# Patient Record
Sex: Male | Born: 1975 | Race: White | Hispanic: No | Marital: Single | Smoking: Current every day smoker
Health system: Southern US, Community
[De-identification: ages and names within clinical notes are randomized; demographics above are authoritative.]

## PROBLEM LIST (undated history)

## (undated) DIAGNOSIS — J449 Chronic obstructive pulmonary disease, unspecified: Secondary | ICD-10-CM

## (undated) HISTORY — PX: APPENDECTOMY: SHX54

## (undated) HISTORY — PX: CHOLECYSTECTOMY: SHX55

## (undated) HISTORY — PX: OTHER SURGICAL HISTORY: SHX169

---

## 1999-05-19 ENCOUNTER — Emergency Department (HOSPITAL_COMMUNITY): Admission: EM | Admit: 1999-05-19 | Discharge: 1999-05-19 | Payer: Self-pay | Admitting: Emergency Medicine

## 1999-05-19 ENCOUNTER — Encounter: Payer: Self-pay | Admitting: Emergency Medicine

## 2004-06-11 ENCOUNTER — Emergency Department: Payer: Self-pay | Admitting: Emergency Medicine

## 2004-09-26 ENCOUNTER — Emergency Department: Payer: Self-pay | Admitting: Emergency Medicine

## 2006-07-27 ENCOUNTER — Emergency Department: Payer: Self-pay | Admitting: Unknown Physician Specialty

## 2006-08-14 ENCOUNTER — Emergency Department: Payer: Self-pay | Admitting: Unknown Physician Specialty

## 2006-09-11 ENCOUNTER — Emergency Department: Payer: Self-pay | Admitting: Emergency Medicine

## 2006-11-18 ENCOUNTER — Emergency Department: Payer: Self-pay | Admitting: Emergency Medicine

## 2007-02-15 ENCOUNTER — Emergency Department: Payer: Self-pay | Admitting: Emergency Medicine

## 2007-07-27 ENCOUNTER — Emergency Department: Payer: Self-pay | Admitting: Emergency Medicine

## 2007-12-21 ENCOUNTER — Emergency Department: Payer: Self-pay | Admitting: Emergency Medicine

## 2007-12-25 ENCOUNTER — Other Ambulatory Visit: Payer: Self-pay

## 2007-12-25 ENCOUNTER — Emergency Department: Payer: Self-pay | Admitting: Emergency Medicine

## 2008-01-12 ENCOUNTER — Emergency Department: Payer: Self-pay | Admitting: Emergency Medicine

## 2008-01-12 ENCOUNTER — Other Ambulatory Visit: Payer: Self-pay

## 2008-05-12 ENCOUNTER — Emergency Department: Payer: Self-pay | Admitting: Emergency Medicine

## 2008-05-13 ENCOUNTER — Other Ambulatory Visit: Payer: Self-pay

## 2008-09-18 ENCOUNTER — Observation Stay: Payer: Self-pay | Admitting: General Surgery

## 2008-10-09 ENCOUNTER — Emergency Department: Payer: Self-pay | Admitting: Emergency Medicine

## 2009-01-06 ENCOUNTER — Emergency Department: Payer: Self-pay | Admitting: Emergency Medicine

## 2009-02-08 ENCOUNTER — Emergency Department (HOSPITAL_COMMUNITY): Admission: EM | Admit: 2009-02-08 | Discharge: 2009-02-08 | Payer: Self-pay

## 2009-02-21 ENCOUNTER — Emergency Department: Payer: Self-pay | Admitting: Emergency Medicine

## 2009-04-08 ENCOUNTER — Emergency Department (HOSPITAL_COMMUNITY): Admission: EM | Admit: 2009-04-08 | Discharge: 2009-04-08 | Payer: Self-pay | Admitting: Emergency Medicine

## 2009-04-12 ENCOUNTER — Emergency Department: Payer: Self-pay | Admitting: Emergency Medicine

## 2009-05-09 ENCOUNTER — Emergency Department: Payer: Self-pay | Admitting: Emergency Medicine

## 2009-06-16 ENCOUNTER — Emergency Department: Payer: Self-pay

## 2014-08-30 ENCOUNTER — Emergency Department: Payer: Self-pay | Admitting: Internal Medicine

## 2014-10-01 ENCOUNTER — Emergency Department: Payer: Self-pay | Admitting: Student

## 2014-10-01 LAB — BASIC METABOLIC PANEL
Anion Gap: 6 — ABNORMAL LOW (ref 7–16)
BUN: 11 mg/dL (ref 7–18)
Calcium, Total: 8.3 mg/dL — ABNORMAL LOW (ref 8.5–10.1)
Chloride: 106 mmol/L (ref 98–107)
Co2: 28 mmol/L (ref 21–32)
Creatinine: 0.84 mg/dL (ref 0.60–1.30)
EGFR (African American): >60
EGFR (Non-African Amer.): >60
Glucose: 111 mg/dL — ABNORMAL HIGH (ref 65–99)
Osmolality: 279 (ref 275–301)
Potassium: 3.8 mmol/L (ref 3.5–5.1)
Sodium: 140 mmol/L (ref 136–145)

## 2014-10-01 LAB — CBC
HCT: 45.8 % (ref 40.0–52.0)
HGB: 15.3 g/dL (ref 13.0–18.0)
MCH: 29.7 pg (ref 26.0–34.0)
MCHC: 33.4 g/dL (ref 32.0–36.0)
MCV: 89 fL (ref 80–100)
Platelet: 294 10*3/uL (ref 150–440)
RBC: 5.16 10*6/uL (ref 4.40–5.90)
RDW: 13.3 % (ref 11.5–14.5)
WBC: 11.2 10*3/uL — ABNORMAL HIGH (ref 3.8–10.6)

## 2014-10-01 LAB — D-DIMER(ARMC): D-DIMER: 322 ng/mL

## 2014-10-01 LAB — TROPONIN I: Troponin-I: <0.02 ng/mL

## 2015-02-27 ENCOUNTER — Emergency Department: Payer: Medicaid Other

## 2015-02-27 ENCOUNTER — Encounter: Payer: Self-pay | Admitting: Emergency Medicine

## 2015-02-27 ENCOUNTER — Emergency Department
Admission: EM | Admit: 2015-02-27 | Discharge: 2015-02-27 | Disposition: A | Discharge status: Home / Self Care | Payer: Medicaid Other | Attending: Emergency Medicine | Admitting: Emergency Medicine

## 2015-02-27 DIAGNOSIS — Z720 Tobacco use: Secondary | ICD-10-CM | POA: Insufficient documentation

## 2015-02-27 DIAGNOSIS — M25562 Pain in left knee: Secondary | ICD-10-CM | POA: Diagnosis not present

## 2015-02-27 DIAGNOSIS — Z880 Allergy status to penicillin: Secondary | ICD-10-CM | POA: Diagnosis not present

## 2015-02-27 MED ORDER — KETOROLAC TROMETHAMINE 60 MG/2ML IM SOLN
INTRAMUSCULAR | Status: AC
  Administered 2015-02-27: 60 mg via INTRAMUSCULAR
  Filled 2015-02-27: qty 2

## 2015-02-27 MED ORDER — IBUPROFEN 800 MG PO TABS: 800.0000 mg | ORAL_TABLET | Freq: Three times a day (TID) | ORAL | Status: DC

## 2015-02-27 MED ORDER — KETOROLAC TROMETHAMINE 60 MG/2ML IM SOLN
60.0000 mg | Freq: Once | INTRAMUSCULAR | Status: AC
  Administered 2015-02-27: 60 mg via INTRAMUSCULAR

## 2015-02-27 NOTE — ED Provider Notes (Signed)
[facility] Emergency Department Provider Note  ____________________________________________  Time seen: [id number]  I have reviewed the triage vital signs and the nursing notes.   HISTORY  Chief Complaint Knee Pain   HPI Brent Raymond is a 39 y.o. male is here today with complaint of left knee pain. He states there is no injury involved. 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 He states today his knee began hurting and he is taking ibuprofenwithout improvement. He has been more difficult for him to walk due to the pain. Pain is constant nonradiating posterior left knee. Patient is at low risk for DVT with his only risk factor of smoking. Pain currently is 10 out of 10. Walking makes the pain worse, nothing is making it better.   History reviewed. No pertinent past medical history.  There are no active problems to display for this patient.   Past Surgical History  Procedure Laterality Date  . Appendectomy    . Cholecystectomy    . Tubes in ears as child      Current Outpatient Rx  Name  Route  Sig  Dispense  Refill  . ibuprofen (ADVIL,MOTRIN) 800 MG tablet   Oral   Take 1 tablet (800 mg total) by mouth 3 (three) times daily.   30 tablet   0     Allergies Penicillins  No family history on file.  Social History History  Substance Use Topics  . Smoking status: Current Every Day Smoker -- 1.00 packs/day    Types: Cigarettes  . Smokeless tobacco: Not on file  . Alcohol Use: No    Review of Systems Constitutional: No fever/chills Eyes: No visual changes. ENT: No sore throat. Cardiovascular: Denies chest pain. Respiratory: Denies shortness of breath. Gastrointestinal: No abdominal pain.  No nausea, no vomiting. Genitourinary: Negative for dysuria. Musculoskeletal: Negative for back pain. Skin: Negative for rash. Neurological: Negative for headaches, focal weakness or numbness.  10-point ROS otherwise  negative.  ____________________________________________   PHYSICAL EXAM:  VITAL SIGNS: ED Triage Vitals  Enc Vitals Group     BP 02/27/15 1841 112/78 mmHg     Pulse Rate 02/27/15 1841 65     Resp 02/27/15 1841 20     Temp 02/27/15 1841 98.3 F (36.8 C)     Temp Source 02/27/15 1841 Oral     SpO2 02/27/15 1841 99 %     Weight 02/27/15 1841 145 lb (65.772 kg)     Height 02/27/15 1841 5\' 8"  (1.727 m)     Head Cir --      Peak Flow --      Pain Score --      Pain Loc --      Pain Edu? --      Excl. in GC? --     Constitutional: Alert and oriented. Well appearing and in no acute distress. Eyes: Conjunctivae are normal. PERRL. EOMI. Head: Atraumatic. Nose: No congestion/rhinnorhea. Neck: No stridor.   Hematological/Lymphatic/Immunilogical: No cervical lymphadenopathy. Cardiovascular: Normal rate, regular rhythm. Grossly normal heart sounds.  Good peripheral circulation. Respiratory: Normal respiratory effort.  No retractions. Lungs CTAB. Gastrointestinal: Soft and nontender. No distention. No abdominal bruits. No CVA tenderness. Musculoskeletal: Positive left posterior lower extremity tenderness, no edema.  No joint effusions. Moderate tenderness on palpation of the posterior knee. Range of motion is restricted secondary to pain. Ligaments are stable. Homans sign was negative. Neurologic:  Normal speech and language. No gross focal neurologic deficits are appreciated.  Speech is normal. Gait was not tested secondary to pain. Skin:  Skin is warm, dry and intact. No rash noted. No erythema or warmth Psychiatric: Mood and affect are normal. Speech and behavior are normal.  ____________________________________________   LABS (all labs ordered are listed, but only abnormal results are displayed)  Labs Reviewed - No data to display RADIOLOGY  Left knee per radiologist and reviewed by me was negative I, Tommi Rumps, personally viewed and evaluated these images as part of my  medical decision making.  ____________________________________________   PROCEDURES  Procedure(s) performed: None  Critical Care performed: No  ____________________________________________   INITIAL IMPRESSION / ASSESSMENT AND PLAN / ED COURSE  Pertinent labs & imaging results that were available during my care of the patient were reviewed by me and considered in my medical decision making (see chart for details).  Patient was placed in knee immobilizer. He is also use ice as needed for knee pain. He states that he did get relief from his Toradol shot and is feeling better. He was given a prescription for ibuprofen 800 mg 3 times a day as needed. He is follow-up with his PCP or orthopedist on call if any continued problems. ____________________________________________   FINAL CLINICAL IMPRESSION(S) / ED DIAGNOSES  Final diagnoses:  Knee pain, acute, left      Tommi Rumps, PA-C 02/27/15 2235  Phineas Semen, MD 02/27/15 2236

## 2015-03-12 ENCOUNTER — Emergency Department
Admission: EM | Admit: 2015-03-12 | Discharge: 2015-03-12 | Disposition: A | Discharge status: Home / Self Care | Payer: Medicaid Other | Attending: Student | Admitting: Student

## 2015-03-12 DIAGNOSIS — S0591XA Unspecified injury of right eye and orbit, initial encounter: Secondary | ICD-10-CM | POA: Diagnosis present

## 2015-03-12 DIAGNOSIS — T1501XA Foreign body in cornea, right eye, initial encounter: Secondary | ICD-10-CM

## 2015-03-12 DIAGNOSIS — Z720 Tobacco use: Secondary | ICD-10-CM | POA: Insufficient documentation

## 2015-03-12 DIAGNOSIS — X58XXXA Exposure to other specified factors, initial encounter: Secondary | ICD-10-CM | POA: Diagnosis not present

## 2015-03-12 DIAGNOSIS — Y9389 Activity, other specified: Secondary | ICD-10-CM | POA: Insufficient documentation

## 2015-03-12 DIAGNOSIS — Z880 Allergy status to penicillin: Secondary | ICD-10-CM | POA: Diagnosis not present

## 2015-03-12 DIAGNOSIS — S0501XA Injury of conjunctiva and corneal abrasion without foreign body, right eye, initial encounter: Secondary | ICD-10-CM | POA: Diagnosis not present

## 2015-03-12 DIAGNOSIS — Y998 Other external cause status: Secondary | ICD-10-CM | POA: Insufficient documentation

## 2015-03-12 DIAGNOSIS — Y9289 Other specified places as the place of occurrence of the external cause: Secondary | ICD-10-CM | POA: Diagnosis not present

## 2015-03-12 MED ORDER — TETRACAINE HCL 0.5 % OP SOLN
1.0000 [drp] | Freq: Once | OPHTHALMIC | Status: DC
  Filled 2015-03-12: qty 2

## 2015-03-12 MED ORDER — KETOROLAC TROMETHAMINE 0.5 % OP SOLN: 1.0000 [drp] | Freq: Four times a day (QID) | OPHTHALMIC | Status: DC

## 2015-03-12 MED ORDER — CIPROFLOXACIN HCL 0.3 % OP SOLN
2.0000 [drp] | OPHTHALMIC | Status: DC
Start: 2015-03-12 — End: 2015-03-12
  Administered 2015-03-12: 2 [drp] via OPHTHALMIC
  Filled 2015-03-12: qty 2.5

## 2015-03-12 MED ORDER — FLUORESCEIN SODIUM 1 MG OP STRP: 1.0000 | ORAL_STRIP | Freq: Once | OPHTHALMIC | Status: DC

## 2015-03-12 MED ORDER — FLUORESCEIN SODIUM 1 MG OP STRP
ORAL_STRIP | OPHTHALMIC | Status: AC
  Administered 2015-03-12: 20:00:00
  Filled 2015-03-12: qty 1

## 2015-03-12 MED ORDER — CIPROFLOXACIN HCL 0.3 % OP SOLN: 2.0000 [drp] | OPHTHALMIC | Status: DC

## 2015-03-12 NOTE — Discharge Instructions (Signed)
Eye, Foreign Body The term foreign body refers to any object near, on the surface of or in the eye that should not be there. A foreign body may be a small speck of dirt or dust, a hair or eyelash, a splinter or any object. CAUSES  Foreign bodies can get in the eye by:  Flying pieces of something that was broken or destroyed (debris).  A sudden injury (trauma) to the eye. SYMPTOMS  Symptoms depend on what the foreign body is and where it is in the eye. The most common locations are:  On the inner surface of the upper or lower eyelids or on the covering of the white part of the eye (conjunctiva). Symptoms in this location are:  Irritating and painful, especially when blinking.  Feeling like something is in the eye.  On the surface of the clear covering on the front of the eye (cornea). A corneal foreign body has symptoms that:  Are painful and irritating since the cornea is very sensitive.  Form small "rust rings" around a metallic foreign body. Metallic foreign bodies stick more firmly to the surface of the cornea.  Inside the eyeball. Infection can happen fast and can be hard to treat with antibiotics. This is an extremely dangerous situation. Foreign bodies inside the eye can threaten vision. A person may even loose their eye. Foreign bodies inside the eye may cause:  Great pain.  Immediate loss of vision. DIAGNOSIS  Foreign bodies are found during an exam by an eye specialist. Those that are on the eyelids, conjunctiva or cornea are usually (but not always) easily found. When a foreign body is inside the eyeball, a cataract may form almost right away. This makes it hard for an ophthalmologist to find the foreign body. Special tests may be needed, including ultrasound testing, X-rays and CT scans. TREATMENT   Foreign bodies that are on the eyelids, conjunctiva or cornea are often removed easily and painlessly.  If the foreign body has caused a scratch or abrasion of the cornea,  antibiotic drops, ointments and/or a tight patch called a "pressure patch" may be needed. Follow-up exams will be needed for several days until the abrasion heals.  Surgery is needed right away if the foreign body is inside the eyeball. This is a medical emergency. An antibiotic therapy will likely be given to stop an infection. HOME CARE INSTRUCTIONS  The use of eye patches is not universal. Their use varies from state to state and from caregiver to caregiver. If an eye patch was applied:  Keep the eye patch on for as long as directed by your caregiver until the follow-up appointment.  Do not remove the patch to put in medications unless instructed to do so. When replacing the patch, retape it as it was before. Follow the same procedure if the patch becomes loose.  WARNING: Do not drive or operate machinery while the eye is patched. The ability to judge distances will be impaired.  Only take over-the-counter or prescription medicines for pain, discomfort or fever as directed by the caregiver. If no eye patch was applied:  Keep the eye closed as much as possible. Do not rub the eye.  Wear dark glasses as needed to protect the eyes from bright light.  Do not wear contact lenses until the eye feels normal again, or as instructed.  Wear protective eye covering if there is a risk of eye injury. This is important when working with high speed tools.  Only take over-the-counter or   prescription medicines for pain, discomfort or fever as directed by the caregiver. SEEK IMMEDIATE MEDICAL CARE IF:   Pain increases in the eye or the vision changes.  You or your child has problems with the eye patch.  The injury to the eye appears to be getting larger.  There is discharge from the injured eye.  Swelling and/or soreness (inflammation) develops around the affected eye.  You or your child has an oral temperature above 102 F (38.9 C), not controlled by medicine.  Your baby is older than 3  months with a rectal temperature of 102 F (38.9 C) or higher.  Your baby is 653 months old or younger with a rectal temperature of 100.4 F (38 C) or higher. MAKE SURE YOU:   Understand these instructions.  Will watch your condition.  Will get help right away if you are not doing well or get worse. Document Released: 08/19/2005 Document Revised: 11/11/2011 Document Reviewed: 01/14/2013 [facility] Patient Information 2015 [employer]. This information is not intended to replace advice given to you by your health care provider. Make sure you discuss any questions you have with your health care provider.  Use the eye drops as directed.  Rest the eyes, and wear sunglasses while outdoors.  Follow-up with Dr. Inez PilgrimBrasington tomorrow morning as directed.

## 2015-03-12 NOTE — ED Provider Notes (Signed)
[facility] Emergency Department Provider Note ____________________________________________  Time seen: 1955  I have reviewed the triage vital signs and the nursing notes.  HISTORY  Chief Complaint  Eye Problem  HPI Brent Raymond is a 39 y.o. male to the ED with right eye foreign body sensation for 9 days. He describes on last week he was doing some cutting with a saw, wearing his safety glasses, when he inadvertently got some sawdust into his eye. He flushed at the scene, and attempted to flush at home. He continued with foreign body sensation until his visit to a local urgent care center on Tuesday. He reports they flushed the eye, but he denies that they did any dye testing or gave him any prescription eyedrops. His foreign body sensation, pain, blurred vision, light sensitivity, and excessive tearing has continued until his presentation today.  History reviewed. No pertinent past medical history.  There are no active problems to display for this patient.  Past Surgical History  Procedure Laterality Date  . Appendectomy    . Cholecystectomy    . Tubes in ears as child      Current Outpatient Rx  Name  Route  Sig  Dispense  Refill  . ciprofloxacin (CILOXAN) 0.3 % ophthalmic solution   Right Eye   Place 2 drops into the right eye every 4 (four) hours while awake. Administer 1 drop, every 2 hours, while awake, for 2 days. Then 1 drop, every 4 hours, while awake, for the next 5 days.   5 mL   0   . ibuprofen (ADVIL,MOTRIN) 800 MG tablet   Oral   Take 1 tablet (800 mg total) by mouth 3 (three) times daily.   30 tablet   0   . ketorolac (ACULAR) 0.5 % ophthalmic solution   Both Eyes   Place 1 drop into both eyes 4 (four) times daily.   5 mL   0    Allergies Penicillins  History reviewed. No pertinent family history.  Social History History  Substance Use Topics  . Smoking status: Current Every Day Smoker -- 1.00 packs/day    Types:  Cigarettes  . Smokeless tobacco: Not on file  . Alcohol Use: No   Review of Systems  Constitutional: Negative for fever. Eyes: Positive for eye pain, FBS, and blurred vision. ENT: Negative for sore throat. Cardiovascular: Negative for chest pain. Respiratory: Negative for shortness of breath. Gastrointestinal: Negative for abdominal pain, vomiting and diarrhea. Genitourinary: Negative for dysuria. Musculoskeletal: Negative for back pain. Skin: Negative for rash. Neurological: Negative for headaches, focal weakness or numbness. ____________________________________________  PHYSICAL EXAM:  VITAL SIGNS: ED Triage Vitals  Enc Vitals Group     BP 03/12/15 1838 113/60 mmHg     Pulse Rate 03/12/15 1838 68     Resp 03/12/15 1838 18     Temp 03/12/15 1838 97.9 F (36.6 C)     Temp Source 03/12/15 1838 Oral     SpO2 03/12/15 1838 97 %     Weight 03/12/15 1838 150 lb (68.04 kg)     Height 03/12/15 1838 5\' 8"  (1.727 m)     Head Cir --      Peak Flow --      Pain Score 03/12/15 1847 6     Pain Loc --      Pain Edu? --      Excl. in GC? --    Constitutional: Alert and oriented. Well appearing and in no distress. Eyes: Normocephalic  and atraumatic. Conjunctivae are normal. PERRL. Normal extraocular movements. Mucous membranes are moist. No dye uptake on the right eye. Positive retained FB located centrally in the cornea over the pupil.  Hematological/Lymphatic/Immunilogical: No cervical/preauricular lymphadenopathy. Respiratory: Normal respiratory effort.  Musculoskeletal: Nontender with normal range of motion in all extremities.  Neurologic:  Normal gait without ataxia. Normal speech and language. No gross focal neurologic deficits are appreciated. Skin:  Skin is warm, dry and intact. No rash noted. Psychiatric: Mood and affect are normal. Patient exhibits appropriate insight and judgment. ____________________________________________  PROCEDURES  Unsuccessful attempt to lift FB  using damp, sterile swab, due length of retention and patient comfort.  ____________________________________________  INITIAL IMPRESSION / ASSESSMENT AND PLAN / ED COURSE  Right eye with corneal FB greater than a week. Discussed with Dr. Inez Pilgrim, he will see the patietn in the office first thing in the morning. Prescription Ciprofloxacin ophthalmic solution and Acular for pain.   ____________________________________________  FINAL CLINICAL IMPRESSION(S) / ED DIAGNOSES  Final diagnoses:  Corneal foreign body, right, initial encounter     Lissa Hoard, PA-C 03/12/15 2026  Gayla Doss, MD 03/13/15 [phone]

## 2015-03-12 NOTE — ED Notes (Signed)
Pt states got saw dust in eye 7/1, c/o increased pain and blurred vision since. [facility] without relief. Pain 6/10.

## 2015-03-12 NOTE — ED Notes (Signed)
AAOx3.  Skin warm and dry.  NAD.  D/C home 

## 2015-07-25 ENCOUNTER — Emergency Department
Admission: EM | Admit: 2015-07-25 | Discharge: 2015-07-25 | Disposition: A | Discharge status: Home / Self Care | Payer: Medicaid Other | Attending: Emergency Medicine | Admitting: Emergency Medicine

## 2015-07-25 ENCOUNTER — Encounter: Payer: Self-pay | Admitting: Emergency Medicine

## 2015-07-25 DIAGNOSIS — F17210 Nicotine dependence, cigarettes, uncomplicated: Secondary | ICD-10-CM | POA: Insufficient documentation

## 2015-07-25 DIAGNOSIS — Z880 Allergy status to penicillin: Secondary | ICD-10-CM | POA: Insufficient documentation

## 2015-07-25 DIAGNOSIS — F1410 Cocaine abuse, uncomplicated: Secondary | ICD-10-CM | POA: Insufficient documentation

## 2015-07-25 DIAGNOSIS — F1129 Opioid dependence with unspecified opioid-induced disorder: Secondary | ICD-10-CM | POA: Insufficient documentation

## 2015-07-25 NOTE — Discharge Instructions (Signed)
Finding Treatment for Addiction WHAT IS ADDICTION? Addiction is a complex disease of the brain. It causes an uncontrollable (compulsive) need for a substance. You can be addicted to alcohol, illegal drugs, or prescription medicines such as painkillers. Addiction can also be a behavior, like gambling or shopping. The need for the drug or activity can become so strong that you think about it all the time. You can also become physically dependent on a substance. Addiction can change the way your brain works. Because of these changes, getting more of whatever you are addicted to becomes the most important thing to you and feels better than other activities or relationships. Addiction can lead to changes in health, behavior, emotions, relationships, and choices that affect you and everyone around you. HOW DO I KNOW IF I NEED TREATMENT FOR ADDICTION? Addiction is a progressive disease. Without treatment, addiction can get worse. Living with addiction puts you at higher risk for injury, poor health, lost employment, loss of money, and even death. You might need treatment for addiction if:  You have tried to stop or cut down, but you cannot.  Your addiction is causing physical health problems.  You find it annoying that your friends and family are concerned about your alcohol or substance use.  You feel guilty about substance abuse or a compulsive behavior.  You have lied or tried to hide your addiction.  You need a particular substance or activity to start your day or to calm down.  You are getting in trouble at school, work, home, or with the police.  You have done something illegal to support your addiction.  You are running out of money because of your addiction.  You have no time for anything other than your addiction. WHAT TYPES OF TREATMENT ARE AVAILABLE? The treatment program that is right for you will depend on many factors, including the type of addiction you have. Treatment programs  can be outpatient or inpatient. In an outpatient program, you live at home and go to work or school, but you also go to a clinic for treatment. With an inpatient program, you live and sleep at the program facility during treatment. After treatment, you might need a plan for support during recovery. Other treatment options include:   Medicine.  Some addictions may be treated with prescription medicines.  You might also need medicine to treat anxiety or depression.  Counseling and behavior therapy. Therapy can help individuals and families behave in healthier ways and relate more effectively.  Support groups. Confidential group therapy, such as a 12-step program, can help individuals and families during treatment and recovery. No single type of program is right for everyone. Many treatment programs involve a combination of education, counseling, and a 12-step, spiritually-based approach. Some treatment programs are government sponsored. They are geared for patients who do not have private insurance. Treatment programs can vary in many respects, such as:  Cost and types of insurance that are accepted.  Types of on-site medical services that are offered.  Length of stay, setting, and size.  Overall philosophy of treatment. WHAT SHOULD I CONSIDER WHEN SELECTING A TREATMENT PROGRAM? It is important to think about your individual requirements when selecting a treatment program. There are a number of things to consider, such as:  If the program is certified by the appropriate government agency. Even private programs must be certified and employ certified professionals.  If the program is covered by your insurance. If finances are a concern, the first call you should make   is to your insurance company, if you have health insurance. Ask for a list of treatment programs that are in your network, and confirm any copayments and deductibles that you may have to pay.  If you do not have insurance, or if  you choose to attend a program that does not accept your insurance, discuss whether a payment plan can be set up.  If treatment is available in languages other than English, if needed.  If the program offers detoxification treatment, if needed.  If 12-step meetings are held at the center or if transport is available for patients to attend meetings at other locations.  If the program is professional, organized, and clean.  If the program meets all of your needs, including physical and cultural needs.  If the facility offers specific treatment for your particular addiction.  If support continues to be offered after you have left the program.  If your treatment plan is continually looked at to make sure you are receiving the right treatment at the right time.  If mental health counseling is part of your treatment.  If medicine is included in treatment, if needed.  If your family is included in your treatment plan and if support is offered to them throughout the treatment process.  How the treatment works to prevent relapse. WHERE ELSE CAN I GET HELP?  Your health care provider. Ask him or her to help you find addiction treatment. These discussions are confidential.  The National Council on Alcoholism and Drug Dependence (NCADD). This group has information about treatment centers and programs for people who have an addiction and for family members.  The telephone number is 1-800-NCA-CALL (1-800-622-2255).  The website is https://ncadd.org/about-ncadd/our-affiliates  The Substance Abuse and Mental Health Services Administration (SAMHSA). This group will help you find publicly funded treatment centers, help hotlines, and counseling services near you.  The telephone number is 1-800-662-HELP (1-800-662-4357).  The website is www.findtreatment.samhsa.gov In countries outside of the [place] and [place], look in local directories for contact information for services in your area.   This  information is not intended to replace advice given to you by your health care provider. Make sure you discuss any questions you have with your health care provider.   Document Released: 07/18/2005 Document Revised: 05/10/2015 Document Reviewed: 06/07/2014 Elsevier Interactive Patient Education 2016 Elsevier Inc.  

## 2015-07-25 NOTE — ED Notes (Addendum)
Pt a/o. Complains of generalized pain, "all over". Diet ordered. No labs drawn at this time. 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 Last used 07/23/15.

## 2015-07-25 NOTE — BH Assessment (Signed)
[facility] Assessment Progress Note  Spoke with Dr. Cyril LoosenKinner, who stated that pt does not need a full evaluation since he denies SI, HI, but needs referral to SA facilities. He states that it would be OK to wait until 9 am when [facility] staff takes over.  TTS will notify [facility] staff.

## 2015-07-25 NOTE — Progress Notes (Signed)
Per request of ER MD Cyril Loosen(Kinner), writer provided the pt. with information and instructions on how to access Outpatient Mental Health & Substance Abuse Treatment ([facility])    07/25/2015 Cheryl FlashNicole Broderick Fonseca, MS, NCC, LPCA Therapeutic Triage Specialist

## 2015-07-25 NOTE — ED Provider Notes (Signed)
[facility] Emergency Department Provider Note  ____________________________________________  Time seen: 8:15 AM  I have reviewed the triage vital signs and the nursing notes.   HISTORY  Chief Complaint Desires cocaine and heroin detox   HPI Brent Raymond is a 39 y.o. male who reports that he has been injecting cocaine and heroin for the last few months and wants help with detox. He denies SI or HI. He complains of feeling achy like he is starting withdrawal but denies nausea or vomiting. He reports his last use was on November 20. He denies fevers chills. No rash. No joint swelling or pain. No apparent history of prior detox     History reviewed. No pertinent past medical history.  There are no active problems to display for this patient.   Past Surgical History  Procedure Laterality Date  . Appendectomy    . Cholecystectomy    . Tubes in ears as child      Current Outpatient Rx  Name  Route  Sig  Dispense  Refill  . ciprofloxacin (CILOXAN) 0.3 % ophthalmic solution   Right Eye   Place 2 drops into the right eye every 4 (four) hours while awake. Administer 1 drop, every 2 hours, while awake, for 2 days. Then 1 drop, every 4 hours, while awake, for the next 5 days. Patient not taking: Reported on 07/25/2015   5 mL   0   . ibuprofen (ADVIL,MOTRIN) 800 MG tablet   Oral   Take 1 tablet (800 mg total) by mouth 3 (three) times daily. Patient not taking: Reported on 07/25/2015   30 tablet   0   . ketorolac (ACULAR) 0.5 % ophthalmic solution   Both Eyes   Place 1 drop into both eyes 4 (four) times daily. Patient not taking: Reported on 07/25/2015   5 mL   0     Allergies Penicillins  No family history on file.  Social History Social History  Substance Use Topics  . Smoking status: Current Every Day Smoker -- 1.00 packs/day    Types: Cigarettes  . Smokeless tobacco: None  . Alcohol Use: No    Review of Systems  Constitutional:  Negative for fever. Eyes: Negative for visual changes. ENT: Negative for sore throat Cardiovascular: Negative for chest pain. Respiratory: Negative for cough Gastrointestinal: Negative for nausea Genitourinary: Negative for dysuria. Musculoskeletal: Negative for back pain. Negative for joint pain Skin: Negative for rash. Neurological: Negative for headaches  Psychiatric: No SI or HI    ____________________________________________   PHYSICAL EXAM:  VITAL SIGNS: ED Triage Vitals  Enc Vitals Group     BP 07/25/15 0723 123/70 mmHg     Pulse Rate 07/25/15 0723 83     Resp 07/25/15 0723 18     Temp --      Temp src --      SpO2 07/25/15 0723 100 %     Weight 07/25/15 0723 135 lb (61.236 kg)     Height 07/25/15 0723  (1.727 m)     Head Cir --      Peak Flow --      Pain Score 07/25/15 0723 7     Pain Loc --      Pain Edu? --      Excl. in GC? --     Constitutional: Alert and oriented. Well appearing and in no distress. Eyes: Conjunctivae are normal.  ENT   Head: Normocephalic and atraumatic.   Mouth/Throat: Mucous membranes  are moist. Cardiovascular: Normal rate, regular rhythm. Normal and symmetric distal pulses are present in all extremities. No murmurs, rubs, or gallops. Respiratory: Normal respiratory effort without tachypnea nor retractions. Breath sounds are clear and equal bilaterally.  Gastrointestinal: Soft and non-tender in all quadrants. No distention. There is no CVA tenderness. Genitourinary: deferred Musculoskeletal: Nontender with normal range of motion in all extremities. No lower extremity tenderness nor edema. Neurologic:  Normal speech and language. No gross focal neurologic deficits are appreciated. Skin:  Skin is warm, dry and intact. Track marks noted on right AC, no abscess or infection. Psychiatric: Mood and affect are normal. Patient exhibits appropriate insight and judgment.  ____________________________________________    LABS  (pertinent positives/negatives)  Labs Reviewed - No data to display  ____________________________________________   EKG  None  ____________________________________________    RADIOLOGY I have personally reviewed any xrays that were ordered on this patient: None  ____________________________________________   PROCEDURES  Procedure(s) performed: none  Critical Care performed: none  ____________________________________________   INITIAL IMPRESSION / ASSESSMENT AND PLAN / ED COURSE  Pertinent labs & imaging results that were available during my care of the patient were reviewed by me and considered in my medical decision making (see chart for details).  Patient well-appearing and in no distress. Normal vitals, benign exam. I will discuss with TTS to attempt to arrange outpatient detox. Patient is medically cleared, no labs are necessary at this point  ____________________________________________   FINAL CLINICAL IMPRESSION(S) / ED DIAGNOSES  Final diagnoses:  Opioid dependence with opioid-induced disorder [facility](H[facility])     Jene Everyobert Alysiana Ethridge, MD 07/25/15 1212

## 2015-07-25 NOTE — ED Notes (Signed)
Pt states "I have been on drugs and want help getting off of them", last used heroin and cocaine yesterday, denies any SI or HI

## 2015-10-10 ENCOUNTER — Emergency Department
Admission: EM | Admit: 2015-10-10 | Discharge: 2015-10-10 | Disposition: A | Discharge status: Home / Self Care | Payer: Medicaid Other | Attending: Emergency Medicine | Admitting: Emergency Medicine

## 2015-10-10 ENCOUNTER — Encounter: Payer: Self-pay | Admitting: Emergency Medicine

## 2015-10-10 DIAGNOSIS — Z792 Long term (current) use of antibiotics: Secondary | ICD-10-CM | POA: Insufficient documentation

## 2015-10-10 DIAGNOSIS — F329 Major depressive disorder, single episode, unspecified: Secondary | ICD-10-CM | POA: Insufficient documentation

## 2015-10-10 DIAGNOSIS — Z791 Long term (current) use of non-steroidal anti-inflammatories (NSAID): Secondary | ICD-10-CM | POA: Insufficient documentation

## 2015-10-10 DIAGNOSIS — F1910 Other psychoactive substance abuse, uncomplicated: Secondary | ICD-10-CM

## 2015-10-10 DIAGNOSIS — F1410 Cocaine abuse, uncomplicated: Secondary | ICD-10-CM | POA: Insufficient documentation

## 2015-10-10 DIAGNOSIS — Z880 Allergy status to penicillin: Secondary | ICD-10-CM | POA: Insufficient documentation

## 2015-10-10 DIAGNOSIS — Z79899 Other long term (current) drug therapy: Secondary | ICD-10-CM | POA: Insufficient documentation

## 2015-10-10 DIAGNOSIS — F17210 Nicotine dependence, cigarettes, uncomplicated: Secondary | ICD-10-CM | POA: Insufficient documentation

## 2015-10-10 LAB — CBC WITH DIFFERENTIAL/PLATELET
BASOS ABS: 0 10*3/uL (ref 0–0.1)
BASOS PCT: 1 %
EOS PCT: 3 %
Eosinophils Absolute: 0.3 10*3/uL (ref 0–0.7)
HCT: 43.8 % (ref 40.0–52.0)
Hemoglobin: 14.3 g/dL (ref 13.0–18.0)
LYMPHS PCT: 30 %
Lymphs Abs: 3 10*3/uL (ref 1.0–3.6)
MCH: 28.1 pg (ref 26.0–34.0)
MCHC: 32.8 g/dL (ref 32.0–36.0)
MCV: 85.7 fL (ref 80.0–100.0)
MONO ABS: 0.6 10*3/uL (ref 0.2–1.0)
Monocytes Relative: 6 %
NEUTROS PCT: 60 %
Neutro Abs: 6.1 10*3/uL (ref 1.4–6.5)
Platelets: 361 10*3/uL (ref 150–440)
RBC: 5.11 MIL/uL (ref 4.40–5.90)
RDW: 14.1 % (ref 11.5–14.5)
WBC: 10.1 10*3/uL (ref 3.8–10.6)

## 2015-10-10 LAB — COMPREHENSIVE METABOLIC PANEL
ALBUMIN: 4.1 g/dL (ref 3.5–5.0)
ALT: 14 U/L — ABNORMAL LOW (ref 17–63)
AST: 16 U/L (ref 15–41)
Alkaline Phosphatase: 93 U/L (ref 38–126)
Anion gap: 11 (ref 5–15)
BUN: 24 mg/dL — ABNORMAL HIGH (ref 6–20)
CO2: 25 mmol/L (ref 22–32)
Calcium: 9.5 mg/dL (ref 8.9–10.3)
Chloride: 104 mmol/L (ref 101–111)
Creatinine, Ser: 0.97 mg/dL (ref 0.61–1.24)
GFR calc Af Amer: >60 mL/min (ref >60)
GLUCOSE: 120 mg/dL — AB (ref 65–99)
POTASSIUM: 3.9 mmol/L (ref 3.5–5.1)
Sodium: 140 mmol/L (ref 135–145)
TOTAL PROTEIN: 7.9 g/dL (ref 6.5–8.1)
Total Bilirubin: 0.3 mg/dL (ref 0.3–1.2)

## 2015-10-10 LAB — URINALYSIS COMPLETE WITH MICROSCOPIC (ARMC ONLY)
Bacteria, UA: NONE SEEN
Bilirubin Urine: NEGATIVE
GLUCOSE, UA: NEGATIVE mg/dL
Hgb urine dipstick: NEGATIVE
KETONES UR: NEGATIVE mg/dL
LEUKOCYTES UA: NEGATIVE
Nitrite: NEGATIVE
PROTEIN: NEGATIVE mg/dL
Specific Gravity, Urine: 1.027 (ref 1.005–1.030)
Squamous Epithelial / LPF: NONE SEEN
pH: 5 (ref 5.0–8.0)

## 2015-10-10 LAB — ETHANOL

## 2015-10-10 LAB — URINE DRUG SCREEN, QUALITATIVE (ARMC ONLY)
AMPHETAMINES, UR SCREEN: NOT DETECTED
BENZODIAZEPINE, UR SCRN: NOT DETECTED
Barbiturates, Ur Screen: NOT DETECTED
Cannabinoid 50 Ng, Ur ~~LOC~~: NOT DETECTED
Cocaine Metabolite,Ur ~~LOC~~: POSITIVE — AB
MDMA (Ecstasy)Ur Screen: NOT DETECTED
METHADONE SCREEN, URINE: NOT DETECTED
Opiate, Ur Screen: NOT DETECTED
Phencyclidine (PCP) Ur S: NOT DETECTED
TRICYCLIC, UR SCREEN: NOT DETECTED

## 2015-10-10 MED ORDER — IBUPROFEN 600 MG PO TABS
600.0000 mg | ORAL_TABLET | Freq: Once | ORAL | Status: AC
  Administered 2015-10-10: 600 mg via ORAL

## 2015-10-10 MED ORDER — IBUPROFEN 600 MG PO TABS
ORAL_TABLET | ORAL | Status: AC
  Administered 2015-10-10: 600 mg via ORAL
  Filled 2015-10-10: qty 1

## 2015-10-10 NOTE — ED Provider Notes (Signed)
[facility] Emergency Department Provider Note  Time seen: 11:23 AM  I have reviewed the triage vital signs and the nursing notes.   HISTORY  Chief Complaint Drug Problem    HPI Brent Raymond is a 40 y.o. male with no past medical history who presents the emergency department with substance abuse wishing for detox. According to the patient he has been using cocaine daily for many years. States he is trying to get clean, wishes to get off of cocaine. His last use was yesterday. States depression but denies any SI or HI. Denies any medical complaints. Denies any alcohol use currently.     History reviewed. No pertinent past medical history.  There are no active problems to display for this patient.   Past Surgical History  Procedure Laterality Date  . Appendectomy    . Cholecystectomy    . Tubes in ears as child      Current Outpatient Rx  Name  Route  Sig  Dispense  Refill  . ciprofloxacin (CILOXAN) 0.3 % ophthalmic solution   Right Eye   Place 2 drops into the right eye every 4 (four) hours while awake. Administer 1 drop, every 2 hours, while awake, for 2 days. Then 1 drop, every 4 hours, while awake, for the next 5 days. Patient not taking: Reported on 07/25/2015   5 mL   0   . ibuprofen (ADVIL,MOTRIN) 800 MG tablet   Oral   Take 1 tablet (800 mg total) by mouth 3 (three) times daily. Patient not taking: Reported on 07/25/2015   30 tablet   0   . ketorolac (ACULAR) 0.5 % ophthalmic solution   Both Eyes   Place 1 drop into both eyes 4 (four) times daily. Patient not taking: Reported on 07/25/2015   5 mL   0     Allergies Penicillins  History reviewed. No pertinent family history.  Social History Social History  Substance Use Topics  . Smoking status: Current Every Day Smoker -- 1.00 packs/day    Types: Cigarettes  . Smokeless tobacco: None  . Alcohol Use: No    Review of Systems Constitutional: Negative for  fever. Cardiovascular: Negative for chest pain. Respiratory: Negative for shortness of breath. Gastrointestinal: Negative for abdominal pain Musculoskeletal: Negative for back pain. Neurological: Negative for headache 10-point ROS otherwise negative.  ____________________________________________   PHYSICAL EXAM:  Constitutional: Alert and oriented. Well appearing and in no distress. Eyes: Normal exam ENT   Head: Normocephalic and atraumatic   Mouth/Throat: Mucous membranes are moist. Cardiovascular: Normal rate, regular rhythm. No murmur Respiratory: Normal respiratory effort without tachypnea nor retractions. Breath sounds are clear  Gastrointestinal: Soft and nontender. No distention.   Musculoskeletal: Nontender with normal range of motion in all extremities.  Neurologic:  Normal speech and language. No gross focal neurologic deficits  Skin:  Skin is warm, dry and intact.  Psychiatric: Flat affect, depressed mood. Denies SI or HI.  ____________________________________________    INITIAL IMPRESSION / ASSESSMENT AND PLAN / ED COURSE  Pertinent labs & imaging results that were available during my care of the patient were reviewed by me and considered in my medical decision making (see chart for details).  Patient presents the emergency department wishing for cocaine detox. Last use was yesterday. Denies any other substances. Patient admits depression, flat affect during examination, denies SI or HI. Patient will remain voluntary. We'll have TTS come see the patient and evaluate for possible outpatient detox facilities. Patient agreeable to  plan. Currently awaiting lab results.   Labs are largely within normal limits besides cocaine positive urine toxicology. Alcohol negative. Behavior health to see. ____________________________________________   FINAL CLINICAL IMPRESSION(S) / ED DIAGNOSES  Substance abuse disorder   Minna Antis, MD 10/10/15 1422

## 2015-10-10 NOTE — ED Notes (Signed)
Meal provided - pt observed sitting up in bed - eating  TTS consult complete  Pt to be discharged to home with follow up

## 2015-10-10 NOTE — ED Notes (Signed)
BEHAVIORAL HEALTH ROUNDING Patient sleeping: No. Patient alert and oriented: yes Behavior appropriate: Yes.  ; If no, describe:  Nutrition and fluids offered: yes Toileting and hygiene offered: Yes  Sitter present: q15 minute observations and security  monitoring Law enforcement present: Yes  ODS  

## 2015-10-10 NOTE — ED Notes (Signed)
Pt reports   "I have got a bad toothache."

## 2015-10-10 NOTE — Discharge Instructions (Signed)
Please seek medical attention and help for any thoughts about wanting to harm herself, harm others, any concerning change in behavior, severe depression, inappropriate drug use or any other new or concerning symptoms. Stimulant Use Disorder-Cocaine Cocaine is one of a group of powerful drugs called stimulants. Cocaine has medical uses for stopping nosebleeds and for pain control before minor nose or dental surgery. However, cocaine is misused because of the effects that it produces. These effects include:   A feeling of extreme pleasure.  Alertness.  High energy. Common street names for cocaine include coke, crack, blow, snow, and nose candy. Cocaine is snorted, dissolved in water and injected, or smoked.  Stimulants are addictive because they activate regions of the brain that produce both the pleasurable sensation of "reward" and psychological dependence. Together, these actions account for loss of control and the rapid development of drug dependence. This means you become ill without the drug (withdrawal) and need to keep using it to function.  Stimulant use disorder is use of stimulants that disrupts your daily life. It disrupts relationships with family and friends and how you do your job. Cocaine increases your blood pressure and heart rate. It can cause a heart attack or stroke. Cocaine can also cause death from irregular heart rate or seizures. SYMPTOMS Symptoms of stimulant use disorder with cocaine include:  Use of cocaine in larger amounts or over a longer period of time than intended.  Unsuccessful attempts to cut down or control cocaine use.  A lot of time spent obtaining, using, or recovering from the effects of cocaine.  A strong desire or urge to use cocaine (craving).  Continued use of cocaine in spite of major problems at work, school, or home because of use.  Continued use of cocaine in spite of relationship problems because of use.  Giving up or cutting down on  important life activities because of cocaine use.  Use of cocaine over and over in situations when it is physically hazardous, such as driving a car.  Continued use of cocaine in spite of a physical problem that is likely related to use. Physical problems can include:  Malnutrition.  Nosebleeds.  Chest pain.  High blood pressure.  A hole that develops between the part of your nose that separates your nostrils (perforated nasal septum).  Lung and kidney damage.  Continued use of cocaine in spite of a mental problem that is likely related to use. Mental problems can include:  Schizophrenia-like symptoms.  Depression.  Bipolar mood swings.  Anxiety.  Sleep problems.  Need to use more and more cocaine to get the same effect, or lessened effect over time with use of the same amount of cocaine (tolerance).  Having withdrawal symptoms when cocaine use is stopped, or using cocaine to reduce or avoid withdrawal symptoms. Withdrawal symptoms include:  Depressed or irritable mood.  Low energy or restlessness.  Bad dreams.  Poor or excessive sleep.  Increased appetite. DIAGNOSIS Stimulant use disorder is diagnosed by your health care provider. You may be asked questions about your cocaine use and how it affects your life. A physical exam may be done. A drug screen may be ordered. You may be referred to a mental health professional. The diagnosis of stimulant use disorder requires at least two symptoms within 12 months. The type of stimulant use disorder depends on the number of signs and symptoms you have. The type may be:  Mild. Two or three signs and symptoms.  Moderate. Four or five signs and  symptoms.  Severe. Six or more signs and symptoms. TREATMENT Treatment for stimulant use disorder is usually provided by mental health professionals with training in substance use disorders. The following options are available:  Counseling or talk therapy. Talk therapy addresses the  reasons you use cocaine and ways to keep you from using again. Goals of talk therapy include:  Identifying and avoiding triggers for use.  Handling cravings.  Replacing use with healthy activities.  Support groups. Support groups provide emotional support, advice, and guidance.  Medicine. Certain medicines may decrease cocaine cravings or withdrawal symptoms. HOME CARE INSTRUCTIONS  Take medicines only as directed by your health care provider.  Identify the people and activities that trigger your cocaine use and avoid them.  Keep all follow-up visits as directed by your health care provider. SEEK MEDICAL CARE IF:  Your symptoms get worse or you relapse.  You are not able to take medicines as directed. SEEK IMMEDIATE MEDICAL CARE IF:  You have serious thoughts about hurting yourself or others.  You have a seizure, chest pain, sudden weakness, or loss of speech or vision. FOR MORE INFORMATION  National Institute on Drug Abuse: http://www.price-smith.com/  Substance Abuse and Mental Health Services Administration: SkateOasis.com.pt   This information is not intended to replace advice given to you by your health care provider. Make sure you discuss any questions you have with your health care provider.   Document Released: 08/16/2000 Document Revised: 09/09/2014 Document Reviewed: 09/01/2013 Elsevier Interactive Patient Education [employer]

## 2015-10-10 NOTE — BH Assessment (Signed)
Assessment Note  Brent Raymond is an 40 y.o. male Who presents to the ER seeking assistance with Substance Abuse Treatment. He reports of using Cocaine, by means of IV use. It's on a daily basis. Due to his use, he is having trouble with his relationships with is family and friends. This is his first time seeking out treatment.  Patient denies SI/HI and AV/H.   Discussed patient with ER MD (Dr. Derrill Kay) and patient is able to discharge home when medically cleared. Patient was giving referral information and instructions on how to follow up with Outpatient Treatment ([facility] and [facility]) and [employer].  Writer spoke with patient about following up with [facility] and provided him with contact information for Peer Support Lorella Nimrod [phone]), to help bridge the gap, until he receives Outpatient Mental Health/Substance Abuse Treatment.  With the permission of the patient, Clinical research associate forwarded his contact information to Peer Support, in order that he may contact him.     Diagnosis: Cocaine Use Disorder; Severe  Past Medical History: History reviewed. No pertinent past medical history.  Past Surgical History  Procedure Laterality Date  . Appendectomy    . Cholecystectomy    . Tubes in ears as child      Family History: History reviewed. No pertinent family history.  Social History:  reports that he has been smoking Cigarettes.  He has been smoking about 1.00 pack per day. He does not have any smokeless tobacco history on file. He reports that he uses illicit drugs (Cocaine). He reports that he does not drink alcohol.  Additional Social History:  Alcohol / Drug Use Pain Medications: See PTA Prescriptions: See PTA Over the Counter: See PTA History of alcohol / drug use?: Yes Longest period of sobriety (when/how long): "Day and a half." Negative Consequences of Use: Financial, Personal relationships, Work / Programmer, multimedia Withdrawal Symptoms: Sweats, Tremors, Fever /  Chills Substance #1 Name of Substance 1: Cocaine 1 - Age of First Use: 20 1 - Amount (size/oz): 4 to 5 grams 1 - Frequency: Daily 1 - Duration: "couple of years" 1 - Last Use / Amount: 10/08/2015  CIWA:   COWS:    Allergies:  Allergies  Allergen Reactions  . Penicillins Other (See Comments)    Home Medications:  (Not in a hospital admission)  OB/GYN Status:  No LMP for male patient.  General Assessment Data Location of Assessment: [facility] ED TTS Assessment: In system Is this a Tele or Face-to-Face Assessment?: Face-to-Face Is this an Initial Assessment or a Re-assessment for this encounter?: Initial Assessment Marital status: Single Maiden name: n/a Is patient pregnant?: No Pregnancy Status: No Living Arrangements: Other relatives Can pt return to current living arrangement?: Yes Admission Status: Voluntary Is patient capable of signing voluntary admission?: Yes Referral Source: Self/Family/Friend Insurance type: Medicaid  Medical Screening Exam [facility] Walk-in ONLY) Medical Exam completed: Yes  Crisis Care Plan Living Arrangements: Other relatives Legal Guardian: Other: (None Reported) Name of Psychiatrist: None Reported Name of Therapist: None Reported  Education Status Is patient currently in school?: No Current Grade: n/a Highest grade of school patient has completed: GED Name of school: n/a Contact person: n/a  Risk to self with the past 6 months Suicidal Ideation: No Has patient been a risk to self within the past 6 months prior to admission? : No Suicidal Intent: No Has patient had any suicidal intent within the past 6 months prior to admission? : No Is patient at risk for suicide?: No Suicidal Plan?: No  Has patient had any suicidal plan within the past 6 months prior to admission? : No Access to Means: No What has been your use of drugs/alcohol within the last 12 months?: Cocaine Previous Attempts/Gestures: No How many times?: 0 Other Self Harm  Risks: Active Addiction Triggers for Past Attempts: None known Intentional Self Injurious Behavior: None Family Suicide History: No Recent stressful life event(s): Other (Comment), Financial Problems (Active Addiction) Persecutory voices/beliefs?: No Depression: Yes Depression Symptoms: Tearfulness, Fatigue, Isolating, Guilt, Loss of interest in usual pleasures, Feeling worthless/self pity, Feeling angry/irritable Substance abuse history and/or treatment for substance abuse?: Yes Suicide prevention information given to non-admitted patients: Not applicable  Risk to Others within the past 6 months Homicidal Ideation: No Does patient have any lifetime risk of violence toward others beyond the six months prior to admission? : No Thoughts of Harm to Others: No Current Homicidal Intent: No Current Homicidal Plan: No Access to Homicidal Means: No Identified Victim: None Reported History of harm to others?: No Assessment of Violence: None Noted Violent Behavior Description: None Reported Does patient have access to weapons?: No Criminal Charges Pending?: Yes Describe Pending Criminal Charges: Shopliffing Does patient have a court date: Yes Court Date: 11/01/15 Is patient on probation?: No  Psychosis Hallucinations: None noted Delusions: None noted  Mental Status Report Appearance/Hygiene: In scrubs, Unremarkable, In hospital gown Eye Contact: Fair Motor Activity: Freedom of movement, Unremarkable Speech: Soft, Logical/coherent, Unremarkable Level of Consciousness: Alert Mood: Anxious, Helpless, Sad, Pleasant Affect: Appropriate to circumstance, Depressed, Sad Anxiety Level: Minimal Thought Processes: Coherent, Relevant Judgement: Unimpaired Orientation: Person, Place, Time, Situation, Appropriate for developmental age Obsessive Compulsive Thoughts/Behaviors: Minimal  Cognitive Functioning Concentration: Normal Memory: Recent Intact, Remote Intact IQ: Average Insight:  Fair Impulse Control: Poor Appetite: Fair Weight Loss: 15 (Within last 30 days) Weight Gain: 0 Sleep: Decreased Total Hours of Sleep: 1 (Trouble falling and staying asleep) Vegetative Symptoms: None  ADLScreening [facility] Assessment Services) Patient's cognitive ability adequate to safely complete daily activities?: Yes Patient able to express need for assistance with ADLs?: Yes Independently performs ADLs?: Yes (appropriate for developmental age)  Prior Inpatient Therapy Prior Inpatient Therapy: No Prior Therapy Dates: n/a Prior Therapy Facilty/Provider(s): n/a Reason for Treatment: n/a  Prior Outpatient Therapy Prior Outpatient Therapy: No Prior Therapy Dates: n/a Prior Therapy Facilty/Provider(s): n/a Reason for Treatment: n/a Does patient have an ACCT team?: No Does patient have Intensive In-House Services?  : No Does patient have [facility] services? : No Does patient have P4CC services?: No  ADL Screening (condition at time of admission) Patient's cognitive ability adequate to safely complete daily activities?: Yes Is the patient deaf or have difficulty hearing?: No Does the patient have difficulty seeing, even when wearing glasses/contacts?: No Does the patient have difficulty concentrating, remembering, or making decisions?: No Patient able to express need for assistance with ADLs?: Yes Does the patient have difficulty dressing or bathing?: Yes Independently performs ADLs?: Yes (appropriate for developmental age) Does the patient have difficulty walking or climbing stairs?: No Weakness of Legs: None Weakness of Arms/Hands: None  Home Assistive Devices/Equipment Home Assistive Devices/Equipment: None  Therapy Consults (therapy consults require a physician order) PT Evaluation Needed: No OT Evalulation Needed: No SLP Evaluation Needed: No Abuse/Neglect Assessment (Assessment to be complete while patient is alone) Physical Abuse: Denies Verbal Abuse: Denies Sexual  Abuse: Denies Exploitation of patient/patient's resources: Denies Self-Neglect: Denies Values / Beliefs Cultural Requests During Hospitalization: None Spiritual Requests During Hospitalization: None Consults Spiritual Care Consult Needed: No Social Work  Consult Needed: No Advance Directives (For Healthcare) Does patient have an advance directive?: No Would patient like information on creating an advanced directive?: Yes - Educational materials given    Additional Information 1:1 In Past 12 Months?: No CIRT Risk: No Elopement Risk: No Does patient have medical clearance?: Yes  Child/Adolescent Assessment Running Away Risk: Denies (Patient is an adult)  Disposition:  Disposition Initial Assessment Completed for this Encounter: Yes Disposition of Patient: Outpatient treatment Type of outpatient treatment: Chemical Dependence - Intensive Outpatient  On Site Evaluation by:   Reviewed with Physician:     Lilyan Gilford, MS, LCAS, LPC, NCC, CCSI 10/10/2015 4:31 PM

## 2015-10-10 NOTE — ED Notes (Signed)
Pt wants detox from cocaine.

## 2015-10-10 NOTE — ED Notes (Addendum)

## 2016-05-15 ENCOUNTER — Encounter: Payer: Self-pay | Admitting: Emergency Medicine

## 2016-05-15 ENCOUNTER — Emergency Department
Admission: EM | Admit: 2016-05-15 | Discharge: 2016-05-15 | Disposition: A | Discharge status: Home / Self Care | Payer: Medicaid Other | Attending: Student in an Organized Health Care Education/Training Program | Admitting: Student in an Organized Health Care Education/Training Program

## 2016-05-15 DIAGNOSIS — R0981 Nasal congestion: Secondary | ICD-10-CM | POA: Diagnosis present

## 2016-05-15 DIAGNOSIS — F17210 Nicotine dependence, cigarettes, uncomplicated: Secondary | ICD-10-CM | POA: Diagnosis not present

## 2016-05-15 DIAGNOSIS — J069 Acute upper respiratory infection, unspecified: Secondary | ICD-10-CM | POA: Diagnosis not present

## 2016-05-15 DIAGNOSIS — F1490 Cocaine use, unspecified, uncomplicated: Secondary | ICD-10-CM | POA: Diagnosis not present

## 2016-05-15 DIAGNOSIS — R0781 Pleurodynia: Secondary | ICD-10-CM | POA: Diagnosis not present

## 2016-05-15 DIAGNOSIS — M545 Low back pain: Secondary | ICD-10-CM | POA: Diagnosis not present

## 2016-05-15 DIAGNOSIS — G8929 Other chronic pain: Secondary | ICD-10-CM | POA: Insufficient documentation

## 2016-05-15 MED ORDER — GUAIFENESIN-CODEINE 100-10 MG/5ML PO SYRP: 10.0000 mL | ORAL_SOLUTION | Freq: Three times a day (TID) | ORAL | 0 refills | Status: DC | PRN

## 2016-05-15 MED ORDER — HYDROCOD POLST-CPM POLST ER 10-8 MG/5ML PO SUER
5.0000 mL | Freq: Once | ORAL | Status: AC
  Administered 2016-05-15: 5 mL via ORAL
  Filled 2016-05-15: qty 5

## 2016-05-15 NOTE — ED Triage Notes (Addendum)
Patient ambulatory to triage with steady gait, without difficulty or distress noted; pt reports since this am having nonprod cough, lower back pain, sinus congestion, rib pain, pressure behind eyes

## 2016-05-15 NOTE — ED Provider Notes (Signed)
[facility] Emergency Department Provider Note   ____________________________________________   First MD Initiated Contact with Patient 05/15/16 2032     (approximate)  I have reviewed the triage vital signs and the nursing notes.   HISTORY  Chief Complaint Nasal Congestion and Cough    HPI Brent Raymond is a 40 y.o. male who presents with cough and nasal congestion that began this morning. Also experiencing bilateral sharp pain in lower ribs that is worse with deep inspiration and HA. Cough is nonproductive. Patient has not tried anything to treat symptoms at home. No sick contacts. Smokes 1 ppd, but has smoked less today with current symptoms. Denies fevers, chills, sore throat, abdominal pain, nausea or vomiting, dizziness, numbness or tingling.    History reviewed. No pertinent past medical history.  There are no active problems to display for this patient.   Past Surgical History:  Procedure Laterality Date  . APPENDECTOMY    . CHOLECYSTECTOMY    . tubes in ears as child      Prior to Admission medications   Medication Sig Start Date End Date Taking? Authorizing Provider  guaiFENesin-codeine (ROBITUSSIN AC) 100-10 MG/5ML syrup Take 10 mLs by mouth 3 (three) times daily as needed for cough. 05/15/16   Chinita Pester, FNP    Allergies Penicillins  No family history on file.  Social History Social History  Substance Use Topics  . Smoking status: Current Every Day Smoker    Packs/day: 1.00    Types: Cigarettes  . Smokeless tobacco: Never Used  . Alcohol use No    Review of Systems Constitutional: No fever/chills Eyes: No visual changes. ENT: No sore throat. Positive for nasal congestion.  Cardiovascular: Positive for bilateral pain in lower ribs. Denies palpitations. Respiratory: Positive for cough.  Gastrointestinal: No abdominal pain.  No nausea, no vomiting.  No diarrhea.  No constipation. Musculoskeletal: Positive for low  back pain, but patient reports this is a chronic issue. Skin: Negative for rash. Neurological: Negative for focal weakness or numbness. Positive for generalized HA.  Allergic/Immunilogical: Negative for seasonal allergies.   ____________________________________________   PHYSICAL EXAM:  VITAL SIGNS: ED Triage Vitals  Enc Vitals Group     BP 05/15/16 1953 111/70     Pulse Rate 05/15/16 1953 93     Resp 05/15/16 1953 20     Temp 05/15/16 1953 98 F (36.7 C)     Temp Source 05/15/16 1953 Oral     SpO2 05/15/16 1953 97 %     Weight 05/15/16 1952 150 lb (68 kg)     Height 05/15/16 1952 5\' 8"  (1.727 m)     Head Circumference --      Peak Flow --      Pain Score 05/15/16 1952 7     Pain Loc --      Pain Edu? --      Excl. in GC? --     Constitutional: Alert and oriented. Well appearing and in no acute distress. Eyes: Conjunctivae are normal. PERRL.  Head: Atraumatic. Nose: Moderation congestion present bilaterally, turbinates mildly enlarged bilaterally.  Mouth/Throat: Mucous membranes are moist.  Oropharynx non-erythematous and without swelling. Uvula is midline.  Neck: No stridor. No cervical spine tenderness to palpation. Hematological/Lymphatic/Immunilogical: No cervical lymphadenopathy. Cardiovascular: Normal rate, regular rhythm. Grossly normal heart sounds.  Good peripheral circulation. Respiratory: Normal respiratory effort.  No retractions. Lungs CTAB. Gastrointestinal: Soft and nontender. No distention.  Musculoskeletal: Full ROM in bilateral upper extremities without  pain or difficulty. Tenderness in bilateral lateral thorax reproducible with deep breath. Neurologic:  Normal speech and language. No gross focal neurologic deficits are appreciated.  Skin:  Skin is warm, dry and intact. No rash noted. Psychiatric: Mood and affect are normal. Speech and behavior are normal.  ____________________________________________   LABS (all labs ordered are listed, but only  abnormal results are displayed)  Labs Reviewed - No data to display ____________________________________________  EKG   ____________________________________________  RADIOLOGY   ____________________________________________   PROCEDURES  Procedure(s) performed: None  Procedures  Critical Care performed: No  ____________________________________________   INITIAL IMPRESSION / ASSESSMENT AND PLAN / ED COURSE  Pertinent labs & imaging results that were available during my care of the patient were reviewed by me and considered in my medical decision making (see chart for details).  Patient symptoms likely due to upper respiratory infection. Given prescription for robitussin cough syrup. No other emergency medicine complaints at this time.   Clinical Course     ____________________________________________   FINAL CLINICAL IMPRESSION(S) / ED DIAGNOSES  Final diagnoses:  URI (upper respiratory infection)      NEW MEDICATIONS STARTED DURING THIS VISIT:  Discharge Medication List as of 05/15/2016  8:56 PM    START taking these medications   Details  guaiFENesin-codeine (ROBITUSSIN AC) 100-10 MG/5ML syrup Take 10 mLs by mouth 3 (three) times daily as needed for cough., Starting Wed 05/15/2016, Print         Note:  This document was prepared using Dragon voice recognition software and may include unintentional dictation errors.   Chinita PesterCari B Apolinar Bero, FNP 05/15/16 2209    Willy EddyPatrick Robinson, MD 05/15/16 [phone]

## 2016-05-15 NOTE — ED Notes (Signed)
Pt c/o cough beginning today, pain with inspiration, lower back pain, headache, nasal congestion, nasal drainage beginning today.

## 2016-08-30 ENCOUNTER — Emergency Department
Admission: EM | Admit: 2016-08-30 | Discharge: 2016-08-30 | Disposition: A | Discharge status: Home / Self Care | Payer: Medicaid Other | Attending: Emergency Medicine | Admitting: Emergency Medicine

## 2016-08-30 DIAGNOSIS — Z5321 Procedure and treatment not carried out due to patient leaving prior to being seen by health care provider: Secondary | ICD-10-CM | POA: Insufficient documentation

## 2016-08-30 DIAGNOSIS — H9201 Otalgia, right ear: Secondary | ICD-10-CM | POA: Diagnosis not present

## 2016-08-30 DIAGNOSIS — F17210 Nicotine dependence, cigarettes, uncomplicated: Secondary | ICD-10-CM | POA: Diagnosis not present

## 2016-08-30 NOTE — ED Triage Notes (Signed)
Pt in with co right earache since 2200

## 2016-08-31 ENCOUNTER — Encounter: Payer: Self-pay | Admitting: Emergency Medicine

## 2016-08-31 ENCOUNTER — Emergency Department
Admission: EM | Admit: 2016-08-31 | Discharge: 2016-08-31 | Disposition: A | Discharge status: Home / Self Care | Payer: Medicaid Other | Attending: Emergency Medicine | Admitting: Emergency Medicine

## 2016-08-31 DIAGNOSIS — H60503 Unspecified acute noninfective otitis externa, bilateral: Secondary | ICD-10-CM | POA: Diagnosis not present

## 2016-08-31 DIAGNOSIS — H7293 Unspecified perforation of tympanic membrane, bilateral: Secondary | ICD-10-CM | POA: Diagnosis not present

## 2016-08-31 DIAGNOSIS — H6693 Otitis media, unspecified, bilateral: Secondary | ICD-10-CM | POA: Diagnosis not present

## 2016-08-31 DIAGNOSIS — F17210 Nicotine dependence, cigarettes, uncomplicated: Secondary | ICD-10-CM | POA: Diagnosis not present

## 2016-08-31 DIAGNOSIS — H9203 Otalgia, bilateral: Secondary | ICD-10-CM | POA: Diagnosis present

## 2016-08-31 MED ORDER — NEOMYCIN-POLYMYXIN-HC 3.5-10000-1 OT SOLN
4.0000 [drp] | Freq: Four times a day (QID) | OTIC | 0 refills | Status: AC
Start: 2016-08-31 — End: 2016-09-10

## 2016-08-31 MED ORDER — AMOXICILLIN 500 MG PO TABS: 500.0000 mg | ORAL_TABLET | Freq: Two times a day (BID) | ORAL | 0 refills | Status: AC

## 2016-08-31 MED ORDER — NEOMYCIN-POLYMYXIN-HC 3.5-10000-1 OT SOLN: 4.0000 [drp] | Freq: Four times a day (QID) | OTIC | 0 refills | Status: DC

## 2016-08-31 MED ORDER — AMOXICILLIN-POT CLAVULANATE 875-125 MG PO TABS: 1.0000 | ORAL_TABLET | Freq: Two times a day (BID) | ORAL | 0 refills | Status: DC

## 2016-08-31 MED ORDER — AMOXICILLIN-POT CLAVULANATE 875-125 MG PO TABS
1.0000 | ORAL_TABLET | Freq: Once | ORAL | Status: AC
  Administered 2016-08-31: 1 via ORAL
  Filled 2016-08-31: qty 1

## 2016-08-31 NOTE — ED Notes (Signed)
See triage note left ear pian with drainage

## 2016-08-31 NOTE — ED Triage Notes (Signed)
Ear pain started 2 days ago, was here yesterday but left due to wait times.  Drainage present

## 2016-08-31 NOTE — ED Provider Notes (Signed)
[facility] Emergency Department Provider Note  ____________________________________________  Time seen: Approximately 10:10 AM  I have reviewed the triage vital signs and the nursing notes.   HISTORY  Chief Complaint Otalgia    HPI Brent Raymond is a 40 y.o. male presenting to the emergency department with bilateral ear pain for the past 2 days. Patient states that otalgia is accompanied by brown and bloody drainage and hearing loss. Patient states that prior to onset of otalgia, he has experienced congestion, maxillary sinus tenderness and rhinorrhea. No fever. No history of diabetes. Patient states that he experienced recurrent otitis media as a child and was managed with tympanostomy tube placement bilaterally. Patient has not attempted alleviating measures. He rates ear pain at 10/10 in intensity and describes it as stabbing.    History reviewed. No pertinent past medical history.  There are no active problems to display for this patient.   Past Surgical History:  Procedure Laterality Date  . APPENDECTOMY    . CHOLECYSTECTOMY    . tubes in ears as child      Prior to Admission medications   Medication Sig Start Date End Date Taking? Authorizing Provider  amoxicillin (AMOXIL) 500 MG tablet Take 1 tablet (500 mg total) by mouth 2 (two) times daily. 08/31/16 09/10/16  Dayna BarkerJaclyn M Dereona Kolodny, PA-C  guaiFENesin-codeine (ROBITUSSIN AC) 100-10 MG/5ML syrup Take 10 mLs by mouth 3 (three) times daily as needed for cough. 05/15/16   Chinita Pesterari B Triplett, FNP  neomycin-polymyxin-hydrocortisone (CORTISPORIN) otic solution Place 4 drops into both ears 4 (four) times daily. 08/31/16 09/10/16  Orvil FeilJaclyn M Dema Timmons, PA-C    Allergies Penicillins  History reviewed. No pertinent family history.  Social History Social History  Substance Use Topics  . Smoking status: Current Every Day Smoker    Packs/day: 1.00    Types: Cigarettes  . Smokeless tobacco: Never Used  . Alcohol use No      Review of Systems  Constitutional: No fever/chills Eyes: No visual changes. No discharge ENT: Has bilateral ear pain, maxillary sinus tenderness and congestion.  Cardiovascular: no chest pain. Respiratory: no cough. No SOB. Gastrointestinal: No abdominal pain.  No nausea, no vomiting.  No diarrhea.  No constipation. Skin: Negative for rash, abrasions, lacerations, ecchymosis. Neurological: Negative for headaches 10-point ROS otherwise negative.  ____________________________________________   PHYSICAL EXAM:  VITAL SIGNS: ED Triage Vitals [08/31/16 0908]  Enc Vitals Group     BP 109/66     Pulse Rate 85     Resp 20     Temp 97.9 F (36.6 C)     Temp Source Oral     SpO2 97 %     Weight 150 lb (68 kg)     Height 5\' 8"  (1.727 m)     Head Circumference      Peak Flow      Pain Score 10     Pain Loc      Pain Edu?      Excl. in GC?      Constitutional: Alert and oriented. Well appearing and in no acute distress. Eyes: Conjunctivae are normal. PERRL. EOMI. Head: Atraumatic.Patient has maxillary sinus tenderness bilaterally. ENT:      Ears: Patient's right external auditory canal has thick  mucopurulent discharge with a perforated tympanic membrane. Remnants of tympanic membrane are erythematous with evidence of purulence. Patient's left external auditory canal has brown serosanguineous fluid and blood. Left tympanic membrane is also perforated. No tenderness elicited with palpation of the  mastoid process bilaterally. Patient has pain with palpation of the tragus bilaterally.      Nose: Nasal turbinates appear edematous.      Mouth/Throat: Posterior pharynx is nonerythematous. No tonsillar exudate. Uvula is midline. Neck: FROM. No pain elicited with flexion at the neck.  Cardiovascular: Normal rate, regular rhythm. Normal S1 and S2.  Good peripheral circulation. Respiratory: Normal respiratory effort without tachypnea or retractions. Lungs CTAB. Good air entry to the  bases with no decreased or absent breath sounds.  Skin:  Skin is warm, dry and intact. No rash noted. ____________________________________________   LABS (all labs ordered are listed, but only abnormal results are displayed)  Labs Reviewed - No data to display ____________________________________________  EKG   ____________________________________________  RADIOLOGY   No results found.  ____________________________________________    PROCEDURES  Procedure(s) performed:    Procedures    Medications  amoxicillin-clavulanate (AUGMENTIN) 875-125 MG per tablet 1 tablet (1 tablet Oral Given 08/31/16 1003)     ____________________________________________   INITIAL IMPRESSION / ASSESSMENT AND PLAN / ED COURSE  Pertinent labs & imaging results that were available during my care of the patient were reviewed by me and considered in my medical decision making (see chart for details).  Review of the [place] CSRS was performed in accordance of the [facility] prior to dispensing any controlled drugs.  Clinical Course    Assessment and Plan:  Bilateral perforated tympanic membranes Otitis media - Bilateral  Otitis externa - Bilateral   Patient presents with a 2 day course of bilateral otalgia. On physical exam, patient has bilateral perforation of his tympanic membranes. Patient has purulent exudate in the external auditory canals. Patient has pain with palpation of the tragus. Patient was discharged with amoxicillin. Patient was also discharged with Cortisporin otic solution. Patient was advised to follow-up with otolaryngology in ten days if otalgia persists. Vital signs are reassuring at this time. All patient questions were answered.    ____________________________________________  FINAL CLINICAL IMPRESSION(S) / ED DIAGNOSES  Final diagnoses:  Acute otitis externa of both ears, unspecified type  Perforation of both tympanic membranes      NEW MEDICATIONS STARTED DURING  THIS VISIT:  New Prescriptions   AMOXICILLIN (AMOXIL) 500 MG TABLET    Take 1 tablet (500 mg total) by mouth 2 (two) times daily.   NEOMYCIN-POLYMYXIN-HYDROCORTISONE (CORTISPORIN) OTIC SOLUTION    Place 4 drops into both ears 4 (four) times daily.        This chart was dictated using voice recognition software/Dragon. Despite best efforts to proofread, errors can occur which can change the meaning. Any change was purely unintentional.    Orvil FeilJaclyn M Arlanda Shiplett, PA-C 08/31/16 1139    Governor Rooksebecca Lord, MD 08/31/16 [phone]

## 2016-08-31 NOTE — ED Notes (Signed)
FIRST NURSE NOTE: ear pain and drainage x 2 days bilaterally, right worse than left. Decreased hearing.

## 2017-08-25 ENCOUNTER — Encounter: Payer: Self-pay | Admitting: Emergency Medicine

## 2017-08-25 ENCOUNTER — Emergency Department
Admission: EM | Admit: 2017-08-25 | Discharge: 2017-08-25 | Disposition: A | Discharge status: Home / Self Care | Payer: Self-pay | Attending: Emergency Medicine | Admitting: Emergency Medicine

## 2017-08-25 ENCOUNTER — Emergency Department: Payer: Self-pay

## 2017-08-25 DIAGNOSIS — W503XXA Accidental bite by another person, initial encounter: Secondary | ICD-10-CM

## 2017-08-25 DIAGNOSIS — T07XXXA Unspecified multiple injuries, initial encounter: Secondary | ICD-10-CM | POA: Insufficient documentation

## 2017-08-25 DIAGNOSIS — F17210 Nicotine dependence, cigarettes, uncomplicated: Secondary | ICD-10-CM | POA: Insufficient documentation

## 2017-08-25 DIAGNOSIS — Y929 Unspecified place or not applicable: Secondary | ICD-10-CM | POA: Insufficient documentation

## 2017-08-25 DIAGNOSIS — S0003XA Contusion of scalp, initial encounter: Secondary | ICD-10-CM | POA: Insufficient documentation

## 2017-08-25 DIAGNOSIS — Y939 Activity, unspecified: Secondary | ICD-10-CM | POA: Insufficient documentation

## 2017-08-25 DIAGNOSIS — Y999 Unspecified external cause status: Secondary | ICD-10-CM | POA: Insufficient documentation

## 2017-08-25 MED ORDER — HYDROCODONE-ACETAMINOPHEN 5-325 MG PO TABS: 1.0000 | ORAL_TABLET | Freq: Four times a day (QID) | ORAL | 0 refills | Status: DC | PRN

## 2017-08-25 MED ORDER — METRONIDAZOLE 500 MG PO TABS: 500.0000 mg | ORAL_TABLET | Freq: Two times a day (BID) | ORAL | 0 refills | Status: DC

## 2017-08-25 MED ORDER — HYDROCODONE-ACETAMINOPHEN 5-325 MG PO TABS
1.0000 | ORAL_TABLET | Freq: Once | ORAL | Status: AC
  Administered 2017-08-25: 1 via ORAL
  Filled 2017-08-25: qty 1

## 2017-08-25 MED ORDER — SULFAMETHOXAZOLE-TRIMETHOPRIM 800-160 MG PO TABS: 1.0000 | ORAL_TABLET | Freq: Two times a day (BID) | ORAL | 0 refills | Status: DC
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
## 2017-08-25 NOTE — Discharge Instructions (Signed)
Clean bite area daily with mild soap and water. Watch for signs of infection. Begin taking antibiotics to prevent infection. Also Norco one every 6 hours as needed for pain. You may need to ice on your contusions and muscle soreness to help control pain. Follow-up with your primary care provider or urgent care if any continued problems.
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
## 2019-09-20 ENCOUNTER — Other Ambulatory Visit: Payer: Medicaid Other

## 2022-09-01 ENCOUNTER — Emergency Department: Payer: Medicaid Other

## 2022-09-01 ENCOUNTER — Emergency Department
Admission: EM | Admit: 2022-09-01 | Discharge: 2022-09-01 | Disposition: A | Discharge status: Home / Self Care | Payer: Medicaid Other | Attending: Emergency Medicine | Admitting: Emergency Medicine

## 2022-09-01 ENCOUNTER — Other Ambulatory Visit: Payer: Self-pay

## 2022-09-01 DIAGNOSIS — F17210 Nicotine dependence, cigarettes, uncomplicated: Secondary | ICD-10-CM | POA: Diagnosis not present

## 2022-09-01 DIAGNOSIS — J9801 Acute bronchospasm: Secondary | ICD-10-CM | POA: Diagnosis not present

## 2022-09-01 DIAGNOSIS — R059 Cough, unspecified: Secondary | ICD-10-CM | POA: Diagnosis present

## 2022-09-01 DIAGNOSIS — J209 Acute bronchitis, unspecified: Secondary | ICD-10-CM

## 2022-09-01 LAB — CBC WITH DIFFERENTIAL/PLATELET
Abs Immature Granulocytes: 0.03 10*3/uL (ref 0.00–0.07)
Basophils Absolute: 0.1 10*3/uL (ref 0.0–0.1)
Basophils Relative: 1 %
Eosinophils Absolute: 0.4 10*3/uL (ref 0.0–0.5)
Eosinophils Relative: 3 %
HCT: 48 % (ref 39.0–52.0)
Hemoglobin: 15.9 g/dL (ref 13.0–17.0)
Immature Granulocytes: 0 %
Lymphocytes Relative: 35 %
Lymphs Abs: 3.8 10*3/uL (ref 0.7–4.0)
MCH: 29.8 pg (ref 26.0–34.0)
MCHC: 33.1 g/dL (ref 30.0–36.0)
MCV: 90.1 fL (ref 80.0–100.0)
Monocytes Absolute: 0.5 10*3/uL (ref 0.1–1.0)
Monocytes Relative: 4 %
Neutro Abs: 6.1 10*3/uL (ref 1.7–7.7)
Neutrophils Relative %: 57 %
Platelets: 247 10*3/uL (ref 150–400)
RBC: 5.33 MIL/uL (ref 4.22–5.81)
RDW: 12.9 % (ref 11.5–15.5)
WBC: 10.9 10*3/uL — ABNORMAL HIGH (ref 4.0–10.5)
nRBC: 0 % (ref 0.0–0.2)

## 2022-09-01 LAB — BASIC METABOLIC PANEL
Anion gap: 7 (ref 5–15)
BUN: 13 mg/dL (ref 6–20)
CO2: 26 mmol/L (ref 22–32)
Calcium: 9.4 mg/dL (ref 8.9–10.3)
Chloride: 107 mmol/L (ref 98–111)
Creatinine, Ser: 0.87 mg/dL (ref 0.61–1.24)
GFR, Estimated: >60 mL/min (ref >60)
Glucose, Bld: 93 mg/dL (ref 70–99)
Potassium: 3.9 mmol/L (ref 3.5–5.1)
Sodium: 140 mmol/L (ref 135–145)

## 2022-09-01 LAB — TROPONIN I (HIGH SENSITIVITY): Troponin I (High Sensitivity): 5 ng/L (ref <18)

## 2022-09-01 MED ORDER — PREDNISONE 50 MG PO TABS: 50.0000 mg | ORAL_TABLET | Freq: Every day | ORAL | 0 refills | Status: DC

## 2022-09-01 MED ORDER — IPRATROPIUM-ALBUTEROL 0.5-2.5 (3) MG/3ML IN SOLN
3.0000 mL | Freq: Once | RESPIRATORY_TRACT | Status: AC
  Administered 2022-09-01: 3 mL via RESPIRATORY_TRACT
  Filled 2022-09-01: qty 3

## 2022-09-01 MED ORDER — PREDNISONE 20 MG PO TABS
60.0000 mg | ORAL_TABLET | Freq: Once | ORAL | Status: AC
  Administered 2022-09-01: 60 mg via ORAL
  Filled 2022-09-01: qty 3

## 2022-09-01 MED ORDER — ALBUTEROL SULFATE HFA 108 (90 BASE) MCG/ACT IN AERS: 2.0000 | INHALATION_SPRAY | Freq: Four times a day (QID) | RESPIRATORY_TRACT | 2 refills | Status: AC | PRN

## 2022-09-01 NOTE — ED Triage Notes (Signed)
Patient reports worsening LEFT sided chest pain that began approx. 1 month ago; Patient worsens with coughing, says he sometimes feels like he can't catch his breath and will become dizzy

## 2022-09-01 NOTE — ED Provider Notes (Signed)
[facility] Provider Note    Event Date/Time   First MD Initiated Contact with Patient 09/01/22 1539     (approximate)   History   Chest pain   HPI  Brent Raymond is a 46 y.o. male who presents with complaints of cough, mild shortness of breath and discomfort in the left side of his chest.  Patient reports he has been coughing for nearly a month, he does smoke cigarettes.  Over the last 2 to 3 days he has had discomfort in the left side of his chest when he coughs.  No pain with breathing.  No fevers.  No calf pain or swelling     Physical Exam   Triage Vital Signs: ED Triage Vitals  Enc Vitals Group     BP 09/01/22 1457 130/80     Pulse Rate 09/01/22 1457 83     Resp 09/01/22 1457 19     Temp 09/01/22 1457 98.4 F (36.9 C)     Temp Source 09/01/22 1457 Oral     SpO2 09/01/22 1457 97 %     Weight 09/01/22 1458 83.9 kg (185 lb)     Height 09/01/22 1458 1.727 m (5\' 8" )     Head Circumference --      Peak Flow --      Pain Score 09/01/22 1458 8     Pain Loc --      Pain Edu? --      Excl. in [place]? --     Most recent vital signs: Vitals:   09/01/22 1457  BP: 130/80  Pulse: 83  Resp: 19  Temp: 98.4 F (36.9 C)  SpO2: 97%     General: Awake, no distress.  CV:  Good peripheral perfusion.  No rash, no chest wall tenderness palpation Resp:  Normal effort.  Scattered wheezes moderate airflow Abd:  No distention.  Other:     ED Results / Procedures / Treatments   Labs (all labs ordered are listed, but only abnormal results are displayed) Labs Reviewed  CBC WITH DIFFERENTIAL/PLATELET - Abnormal; Notable for the following components:      Result Value   WBC 10.9 (*)    All other components within normal limits  BASIC METABOLIC PANEL  TROPONIN I (HIGH SENSITIVITY)     EKG  ED ECG REPORT I, Lavonia Drafts, the attending physician, personally viewed and interpreted this ECG.  Date: 09/01/2022  Rhythm: normal sinus rhythm QRS  Axis: normal Intervals: normal ST/T Wave abnormalities: normal Narrative Interpretation: no evidence of acute ischemia    RADIOLOGY Chest x-ray viewed interpret by me, no acute abnormality    PROCEDURES:  Critical Care performed:   Procedures   MEDICATIONS ORDERED IN ED: Medications  ipratropium-albuterol (DUONEB) 0.5-2.5 (3) MG/3ML nebulizer solution 3 mL (3 mLs Nebulization Given 09/01/22 1618)  ipratropium-albuterol (DUONEB) 0.5-2.5 (3) MG/3ML nebulizer solution 3 mL (3 mLs Nebulization Given 09/01/22 1618)  predniSONE (DELTASONE) tablet 60 mg (60 mg Oral Given 09/01/22 1658)     IMPRESSION / MDM / ASSESSMENT AND PLAN / ED COURSE  I reviewed the triage vital signs and the nursing notes. Patient's presentation is most consistent with acute presentation with potential threat to life or bodily function.   Patient presents with chest discomfort as above in the setting of a cough.  Suspicious for bronchitis with bronchospasm given poor airflow on exam and smoker.  Treated with DuoNebs with significant improvement in breathing, he feels markedly better.  His lab  work is reviewed and is overall reassuring, no evidence of pneumonia on chest x-ray.  Not consistent with PE, smoking cessation strongly encouraged      FINAL CLINICAL IMPRESSION(S) / ED DIAGNOSES   Final diagnoses:  Bronchitis with bronchospasm     Rx / DC Orders   ED Discharge Orders          Ordered    predniSONE (DELTASONE) 50 MG tablet  Daily with breakfast        09/01/22 1654    albuterol (VENTOLIN HFA) 108 (90 Base) MCG/ACT inhaler  Every 6 hours PRN        09/01/22 1654             Note:  This document was prepared using Dragon voice recognition software and may include unintentional dictation errors.   Jene Every, MD 09/01/22 2033

## 2022-09-10 ENCOUNTER — Other Ambulatory Visit: Payer: Self-pay

## 2022-09-10 ENCOUNTER — Emergency Department
Admission: EM | Admit: 2022-09-10 | Discharge: 2022-09-10 | Disposition: A | Discharge status: Home / Self Care | Payer: Medicaid Other | Attending: Emergency Medicine | Admitting: Emergency Medicine

## 2022-09-10 ENCOUNTER — Emergency Department: Payer: Medicaid Other

## 2022-09-10 DIAGNOSIS — U071 COVID-19: Secondary | ICD-10-CM | POA: Diagnosis not present

## 2022-09-10 DIAGNOSIS — R109 Unspecified abdominal pain: Secondary | ICD-10-CM | POA: Diagnosis not present

## 2022-09-10 DIAGNOSIS — J069 Acute upper respiratory infection, unspecified: Secondary | ICD-10-CM | POA: Diagnosis not present

## 2022-09-10 DIAGNOSIS — R053 Chronic cough: Secondary | ICD-10-CM

## 2022-09-10 DIAGNOSIS — R059 Cough, unspecified: Secondary | ICD-10-CM | POA: Diagnosis present

## 2022-09-10 LAB — BASIC METABOLIC PANEL
Anion gap: 10 (ref 5–15)
BUN: 15 mg/dL (ref 6–20)
CO2: 24 mmol/L (ref 22–32)
Calcium: 8.7 mg/dL — ABNORMAL LOW (ref 8.9–10.3)
Chloride: 103 mmol/L (ref 98–111)
Creatinine, Ser: 0.96 mg/dL (ref 0.61–1.24)
GFR, Estimated: >60 mL/min (ref >60)
Glucose, Bld: 102 mg/dL — ABNORMAL HIGH (ref 70–99)
Potassium: 3.6 mmol/L (ref 3.5–5.1)
Sodium: 137 mmol/L (ref 135–145)

## 2022-09-10 LAB — RESP PANEL BY RT-PCR (RSV, FLU A&B, COVID)  RVPGX2
Influenza A by PCR: NEGATIVE
Influenza B by PCR: NEGATIVE
Resp Syncytial Virus by PCR: NEGATIVE
SARS Coronavirus 2 by RT PCR: POSITIVE — AB

## 2022-09-10 LAB — CBC
HCT: 45.1 % (ref 39.0–52.0)
Hemoglobin: 14.9 g/dL (ref 13.0–17.0)
MCH: 30 pg (ref 26.0–34.0)
MCHC: 33 g/dL (ref 30.0–36.0)
MCV: 90.9 fL (ref 80.0–100.0)
Platelets: 254 10*3/uL (ref 150–400)
RBC: 4.96 MIL/uL (ref 4.22–5.81)
RDW: 13.2 % (ref 11.5–15.5)
WBC: 11.1 10*3/uL — ABNORMAL HIGH (ref 4.0–10.5)
nRBC: 0 % (ref 0.0–0.2)

## 2022-09-10 LAB — TROPONIN I (HIGH SENSITIVITY): Troponin I (High Sensitivity): 2 ng/L (ref <18)

## 2022-09-10 MED ORDER — BENZONATATE 100 MG PO CAPS: 100.0000 mg | ORAL_CAPSULE | Freq: Three times a day (TID) | ORAL | 0 refills | Status: DC | PRN

## 2022-09-10 MED ORDER — NIRMATRELVIR/RITONAVIR (PAXLOVID)TABLET: 3.0000 | ORAL_TABLET | Freq: Two times a day (BID) | ORAL | 0 refills | Status: AC

## 2022-09-10 NOTE — ED Notes (Signed)
Reports chest discomfort, back pain, shoulder pain when coughing. Pt reports feeling dizzy at times when standing.

## 2022-09-10 NOTE — ED Provider Notes (Signed)
[facility] Provider Note   Event Date/Time   First MD Initiated Contact with Patient 09/10/22 1720     (approximate) History  Cough (Chest pain/)  HPI Brent Raymond is a 47 y.o. male with a stated past medical history of tobacco abuse who presents for complaints of cough for the last 2 months and [place] significantly over the past 2 days.  Patient states that it has been a productive cough throughout this 74-month period.  Patient denies any recent travel or sick contacts.  Patient denies any dyspnea on exertion.  Patient also complains of left flank pain at the lower ribs after he stated he had a coughing fit yesterday and felt a "pop" to the back along this area. ROS: Patient currently denies any vision changes, tinnitus, difficulty speaking, facial droop, sore throat, abdominal pain, nausea/vomiting/diarrhea, dysuria, or weakness/numbness/paresthesias in any extremity   Physical Exam  Triage Vital Signs: ED Triage Vitals  Enc Vitals Group     BP 09/10/22 1511 128/77     Pulse Rate 09/10/22 1511 98     Resp 09/10/22 1511 20     Temp 09/10/22 1511 98.4 F (36.9 C)     Temp Source 09/10/22 1511 Oral     SpO2 09/10/22 1511 96 %     Weight 09/10/22 1511 190 lb (86.2 kg)     Height 09/10/22 1511 5\' 8"  (1.727 m)     Head Circumference --      Peak Flow --      Pain Score 09/10/22 1517 8     Pain Loc --      Pain Edu? --      Excl. in [place]? --    Most recent vital signs: Vitals:   09/10/22 1730 09/10/22 1809  BP: 114/72   Pulse: 91   Resp: 18   Temp:  98.4 F (36.9 C)  SpO2: 98%    General: Awake, oriented x4. CV:  Good peripheral perfusion.  Resp:  Normal effort.  Clear to auscultation bilaterally Abd:  No distention.  Other:  Middle-aged Caucasian male laying in bed in no acute distress with odor of tobacco ED Results / Procedures / Treatments  Labs (all labs ordered are listed, but only abnormal results are displayed) Labs Reviewed  RESP  PANEL BY RT-PCR (RSV, FLU A&B, COVID)  RVPGX2 - Abnormal; Notable for the following components:      Result Value   SARS Coronavirus 2 by RT PCR POSITIVE (*)    All other components within normal limits  BASIC METABOLIC PANEL - Abnormal; Notable for the following components:   Glucose, Bld 102 (*)    Calcium 8.7 (*)    All other components within normal limits  CBC - Abnormal; Notable for the following components:   WBC 11.1 (*)    All other components within normal limits  TROPONIN I (HIGH SENSITIVITY)  TROPONIN I (HIGH SENSITIVITY)   EKG ED ECG REPORT I, Naaman Plummer, the attending physician, personally viewed and interpreted this ECG. Date: 09/10/2022 EKG Time: 1514 Rate: 99 Rhythm: normal sinus rhythm QRS Axis: normal Intervals: normal ST/T Wave abnormalities: normal Narrative Interpretation: no evidence of acute ischemia RADIOLOGY ED MD interpretation: 2 view chest x-ray interpreted by me shows no evidence of acute abnormalities including no pneumonia, pneumothorax, or widened mediastinum -Agree with radiology assessment Official radiology report(s): DG Chest 2 View  Result Date: 09/10/2022 CLINICAL DATA:  One-month history of left-sided chest pain EXAM: CHEST -  2 VIEW COMPARISON:  Chest radiograph dated 09/01/2022 FINDINGS: Normal lung volumes. No focal consolidations. No pleural effusion or pneumothorax. The heart size and mediastinal contours are within normal limits. The visualized skeletal structures are unremarkable. IMPRESSION: No active cardiopulmonary disease. Electronically Signed   By: Agustin Cree M.D.   On: 09/10/2022 15:37   PROCEDURES: Critical Care performed: No Procedures MEDICATIONS ORDERED IN ED: Medications - No data to display IMPRESSION / MDM / ASSESSMENT AND PLAN / ED COURSE  I reviewed the triage vital signs and the nursing notes.                              Patient's presentation is most consistent with acute presentation with potential threat to  life or bodily function. Presentation most consistent with Viral Syndrome.  Patient has tested positive for COVID-19. Based on vitals and exam they are nontoxic and stable for discharge.  Given History and Exam I have a lower suspicion for: Emergent CardioPulmonary causes [such as Acute Asthma or COPD Exacerbation, acute Heart Failure or exacerbation, PE, PTX, atypical ACS, PNA]. Emergent Otolaryngeal causes [such as PTA, RPA, Ludwigs, Epiglottitis, EBV].  Regarding Emergent Travel or Immunosuppressive related infectious: I have a low suspicion for acute HIV.  Will provide strict return precautions and instructions on self-isolation/quarantine and anticipatory guidance.   FINAL CLINICAL IMPRESSION(S) / ED DIAGNOSES   Final diagnoses:  COVID-19 virus infection  Chronic cough  Upper respiratory tract infection, unspecified type   Rx / DC Orders   ED Discharge Orders          Ordered    nirmatrelvir/ritonavir (PAXLOVID) 20 x 150 MG & 10 x 100MG  TABS  2 times daily        09/10/22 1800    benzonatate (TESSALON PERLES) 100 MG capsule  3 times daily PRN        09/10/22 1800           Note:  This document was prepared using Dragon voice recognition software and may include unintentional dictation errors.   11/09/22, MD 09/10/22 [phone]

## 2022-09-10 NOTE — Discharge Instructions (Addendum)
Please use ibuprofen (Motrin) up to 800 mg every 8 hours, naproxen (Naprosyn) up to 500 mg every 12 hours, and/or acetaminophen (Tylenol) up to 4 g/day for any continued pain 

## 2022-09-10 NOTE — ED Triage Notes (Signed)
Pt to Ed by car for same symptoms on 12/31. Pt is CAOx4 and in  no acute distress at this time. Pt has cough, congestion and chest pain. Pt has active cough and is coughing up mucus. 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

## 2022-11-27 ENCOUNTER — Emergency Department: Payer: Medicaid Other

## 2022-11-27 ENCOUNTER — Encounter: Payer: Self-pay | Admitting: *Deleted

## 2022-11-27 ENCOUNTER — Other Ambulatory Visit: Payer: Self-pay

## 2022-11-27 ENCOUNTER — Emergency Department
Admission: EM | Admit: 2022-11-27 | Discharge: 2022-11-27 | Disposition: A | Discharge status: Home / Self Care | Payer: Medicaid Other | Attending: Emergency Medicine | Admitting: Emergency Medicine

## 2022-11-27 DIAGNOSIS — Z1152 Encounter for screening for COVID-19: Secondary | ICD-10-CM | POA: Insufficient documentation

## 2022-11-27 DIAGNOSIS — J449 Chronic obstructive pulmonary disease, unspecified: Secondary | ICD-10-CM | POA: Insufficient documentation

## 2022-11-27 DIAGNOSIS — R059 Cough, unspecified: Secondary | ICD-10-CM | POA: Diagnosis present

## 2022-11-27 DIAGNOSIS — F17210 Nicotine dependence, cigarettes, uncomplicated: Secondary | ICD-10-CM | POA: Diagnosis not present

## 2022-11-27 LAB — RESP PANEL BY RT-PCR (RSV, FLU A&B, COVID)  RVPGX2
Influenza A by PCR: NEGATIVE
Influenza B by PCR: NEGATIVE
Resp Syncytial Virus by PCR: NEGATIVE
SARS Coronavirus 2 by RT PCR: NEGATIVE

## 2022-11-27 MED ORDER — BENZONATATE 100 MG PO CAPS: 100.0000 mg | ORAL_CAPSULE | Freq: Three times a day (TID) | ORAL | 0 refills | Status: AC | PRN

## 2022-11-27 NOTE — Discharge Instructions (Signed)
Continue to use your albuterol inhaler.  Your chest x-ray does not show any signs of TB or pneumonia.  Follow-up with your regular doctor for a screening CT due to your smoking history. Return emergency department if worsening

## 2022-11-27 NOTE — ED Provider Notes (Signed)
[facility] Provider Note    Event Date/Time   First MD Initiated Contact with Patient 11/27/22 2056     (approximate)   History   Cough   HPI  Brent Raymond is a 47 y.o. male with history of COPD presents emergency department complaining about cough for about 4 months.  Patient is concerned as his wife is pregnant and had blood work stating that she may have been exposed to TB.  Patient was recently in prison and would like to be checked.  He denies fever or chills.  Does have an inhaler at home.  Smokes 1 pack/day.  Has smoked for more than 20 years      Physical Exam   Triage Vital Signs: ED Triage Vitals  Enc Vitals Group     BP 11/27/22 2036 125/87     Pulse Rate 11/27/22 2036 77     Resp 11/27/22 2036 18     Temp 11/27/22 2036 98.2 F (36.8 C)     Temp Source 11/27/22 2036 Oral     SpO2 11/27/22 2036 95 %     Weight 11/27/22 2037 185 lb (83.9 kg)     Height 11/27/22 2037 5\' 8"  (1.727 m)     Head Circumference --      Peak Flow --      Pain Score --      Pain Loc --      Pain Edu? --      Excl. in [place]? --     Most recent vital signs: Vitals:   11/27/22 2036  BP: 125/87  Pulse: 77  Resp: 18  Temp: 98.2 F (36.8 C)  SpO2: 95%     General: Awake, no distress.   CV:  Good peripheral perfusion. regular rate and  rhythm Resp:  Normal effort. Lungs cta Abd:  No distention.   Other:      ED Results / Procedures / Treatments   Labs (all labs ordered are listed, but only abnormal results are displayed) Labs Reviewed  RESP PANEL BY RT-PCR (RSV, FLU A&B, COVID)  RVPGX2     EKG     RADIOLOGY Chest x-ray    PROCEDURES:   Procedures   MEDICATIONS ORDERED IN ED: Medications - No data to display   IMPRESSION / MDM / [place] / ED COURSE  I reviewed the triage vital signs and the nursing notes.                              Differential diagnosis includes, but is not limited to, COVID, influenza,  RSV, COPD, TB,   Patient's presentation is most consistent with acute complicated illness / injury requiring diagnostic workup.   Chest x-ray was independently reviewed and interpreted by me as being negative for any acute abnormality.  Did explain this to the patient.  Told him he does not have TB noted on his chest x-ray.  Not sure where she may have had the exposure.  I did offer to do a chest CT for lung cancer screening, patient states he can follow-up with his regular doctor for this testing.  I did call in Tessalon Perles to help him with his cough.  He is to use his albuterol inhaler every 6 hours as needed.  Explained to him COPD the inhalers would help him more than anything else and also smoking cessation was discussed.  Patient is in agreement  treatment plan.  Discharged stable condition.      FINAL CLINICAL IMPRESSION(S) / ED DIAGNOSES   Final diagnoses:  Chronic obstructive pulmonary disease, unspecified COPD type ([place])     Rx / DC Orders   ED Discharge Orders          Ordered    benzonatate (TESSALON PERLES) 100 MG capsule  3 times daily PRN        11/27/22 2208             Note:  This document was prepared using Dragon voice recognition software and may include unintentional dictation errors.    Versie Starks, PA-C 11/27/22 2213    Arta Silence, MD 11/27/22 [phone]

## 2022-11-27 NOTE — ED Triage Notes (Signed)
Pt states he has a cough for 4 months..  pt reports cough worse today.  No fever.  Pt alert.

## 2024-01-09 ENCOUNTER — Ambulatory Visit
Admission: EM | Admit: 2024-01-09 | Discharge: 2024-01-09 | Disposition: A | Discharge status: Home / Self Care | Attending: Physician Assistant | Admitting: Physician Assistant

## 2024-01-09 ENCOUNTER — Encounter: Payer: Self-pay | Admitting: Emergency Medicine

## 2024-01-09 DIAGNOSIS — R051 Acute cough: Secondary | ICD-10-CM | POA: Diagnosis present

## 2024-01-09 DIAGNOSIS — F17200 Nicotine dependence, unspecified, uncomplicated: Secondary | ICD-10-CM | POA: Diagnosis present

## 2024-01-09 DIAGNOSIS — J22 Unspecified acute lower respiratory infection: Secondary | ICD-10-CM | POA: Diagnosis present

## 2024-01-09 DIAGNOSIS — J441 Chronic obstructive pulmonary disease with (acute) exacerbation: Secondary | ICD-10-CM | POA: Diagnosis present

## 2024-01-09 DIAGNOSIS — J3489 Other specified disorders of nose and nasal sinuses: Secondary | ICD-10-CM | POA: Diagnosis present

## 2024-01-09 HISTORY — DX: Chronic obstructive pulmonary disease, unspecified: J44.9

## 2024-01-09 LAB — SARS CORONAVIRUS 2 BY RT PCR: SARS Coronavirus 2 by RT PCR: NEGATIVE

## 2024-01-09 MED ORDER — DOXYCYCLINE HYCLATE 100 MG PO CAPS: 100.0000 mg | ORAL_CAPSULE | Freq: Two times a day (BID) | ORAL | 0 refills | Status: AC

## 2024-01-09 MED ORDER — PREDNISONE 50 MG PO TABS: 50.0000 mg | ORAL_TABLET | Freq: Every day | ORAL | 0 refills | Status: AC

## 2024-01-09 NOTE — ED Triage Notes (Signed)
 Patient c/o cough and chest congestion since Tuesday.  Patient reports sinus pain and pressure and headache for the past 2-3 days.  Patient denies fevers.

## 2024-01-09 NOTE — Discharge Instructions (Addendum)
 Check my chart for Covid results. Take your home meds as prescribed and allergy med(zyrtec) Adding prednisone  ans doxycycline(avoid sun) Please take OTC flonase nasal spray as label directed to help with symptoms Drink plenty of water Follow up with your PCP at [facility] next week if symptoms persist Stop smoking

## 2024-01-09 NOTE — ED Provider Notes (Signed)
 [facility]    CSN: [id number] Arrival date & time: 01/09/24  4098      History   Chief Complaint Chief Complaint  Patient presents with   Sinus Problem   Cough   Headache    HPI Brent Raymond is a 48 y.o. male.   48 year old male patient, Brent Raymond, presents to urgent care for evaluation of cough, congestion since Tuesday.  Patient reports sinus pain and pressure and headache for 3 days.  Patient denies any fevers, smokes half pack a day  PMH: COPD,smoker  The history is provided by the patient. No language interpreter was used.    Past Medical History:  Diagnosis Date   COPD (chronic obstructive pulmonary disease) (HCC)     Patient Active Problem List   Diagnosis Date Noted   COPD exacerbation (HCC) 01/09/2024   Acute cough 01/09/2024   Acute respiratory infection 01/09/2024   Sinus pressure 01/09/2024   Smoker 01/09/2024    Past Surgical History:  Procedure Laterality Date   APPENDECTOMY     CHOLECYSTECTOMY     tubes in ears as child         Home Medications    Prior to Admission medications   Medication Sig Start Date End Date Taking? Authorizing Provider  doxycycline (VIBRAMYCIN) 100 MG capsule Take 1 capsule (100 mg total) by mouth 2 (two) times daily for 7 days. 01/09/24 01/16/24 Yes Gurjit Loconte, Eveleen Hinds, NP  fluticasone-salmeterol (ADVAIR) 250-50 MCG/ACT AEPB Inhale 1 puff into the lungs in the morning and at bedtime. 07/30/23  Yes [provider]  predniSONE  (DELTASONE ) 50 MG tablet Take 1 tablet (50 mg total) by mouth daily with breakfast for 4 days. 01/09/24 01/13/24 Yes Jerae Izard, Eveleen Hinds, NP  albuterol  (VENTOLIN  HFA) 108 (90 Base) MCG/ACT inhaler Inhale 2 puffs into the lungs every 6 (six) hours as needed for wheezing or shortness of breath. 09/01/22   Bryson Carbine, MD  cetirizine (ZYRTEC) 10 MG tablet Take 10 mg by mouth daily.    [provider]  INCRUSE ELLIPTA 62.5 MCG/ACT AEPB Inhale 1 puff into the lungs  daily.    [provider]  omeprazole (PRILOSEC) 20 MG capsule Take 20 mg by mouth daily.    [provider]    Family History History reviewed. No pertinent family history.  Social History Social History   Tobacco Use   Smoking status: Every Day    Current packs/day: 1.00    Types: Cigarettes   Smokeless tobacco: Never  Vaping Use   Vaping status: Never Used  Substance Use Topics   Alcohol use: No   Drug use: Yes    Types: Cocaine     Allergies   Penicillins   Review of Systems Review of Systems  Constitutional:  Negative for fever.  HENT:  Positive for congestion.   Respiratory:  Positive for cough.   All other systems reviewed and are negative.    Physical Exam Triage Vital Signs ED Triage Vitals  Encounter Vitals Group     BP 01/09/24 0850 136/79     Systolic BP Percentile --      Diastolic BP Percentile --      Pulse Rate 01/09/24 0850 79     Resp 01/09/24 0850 16     Temp 01/09/24 0850 98.3 F (36.8 C)     Temp Source 01/09/24 0850 Oral     SpO2 01/09/24 0850 96 %     Weight 01/09/24 0848 184 lb 15.5 oz (83.9 kg)  Height 01/09/24 0848 5\' 8"  (1.727 m)     Head Circumference --      Peak Flow --      Pain Score 01/09/24 0848 7     Pain Loc --      Pain Education --      Exclude from Growth Chart --    No data found.  Updated Vital Signs BP 136/79 (BP Location: Right Arm)   Pulse 79   Temp 98.3 F (36.8 C) (Oral)   Resp 16   Ht 5\' 8"  (1.727 m)   Wt 184 lb 15.5 oz (83.9 kg)   SpO2 96%   BMI 28.12 kg/m   Visual Acuity Right Eye Distance:   Left Eye Distance:   Bilateral Distance:    Right Eye Near:   Left Eye Near:    Bilateral Near:     Physical Exam Vitals and nursing note reviewed.  Constitutional:      General: He is not in acute distress.    Appearance: He is well-developed. He is not ill-appearing or toxic-appearing.  HENT:     Head: Normocephalic.     Right Ear: Tympanic membrane is retracted.      Left Ear: Tympanic membrane is retracted.     Nose: Mucosal edema and congestion present.     Mouth/Throat:     Mouth: Mucous membranes are moist.     Pharynx: Uvula midline.  Eyes:     General: Lids are normal.     Conjunctiva/sclera: Conjunctivae normal.     Pupils: Pupils are equal, round, and reactive to light.  Cardiovascular:     Rate and Rhythm: Normal rate and regular rhythm.     Heart sounds: Normal heart sounds.  Pulmonary:     Effort: Pulmonary effort is normal. No respiratory distress.     Breath sounds: Normal air entry. Examination of the right-lower field reveals decreased breath sounds. Examination of the left-lower field reveals decreased breath sounds. Decreased breath sounds present. No wheezing.  Abdominal:     General: There is no distension.     Palpations: Abdomen is soft.  Musculoskeletal:        General: Normal range of motion.     Cervical back: Normal range of motion.  Skin:    General: Skin is warm and dry.     Findings: No rash.  Neurological:     General: No focal deficit present.     Mental Status: He is alert and oriented to person, place, and time.     GCS: GCS eye subscore is 4. GCS verbal subscore is 5. GCS motor subscore is 6.     Cranial Nerves: No cranial nerve deficit.     Sensory: No sensory deficit.  Psychiatric:        Speech: Speech normal.        Behavior: Behavior normal. Behavior is cooperative.      UC Treatments / Results  Labs (all labs ordered are listed, but only abnormal results are displayed) Labs Reviewed  SARS CORONAVIRUS 2 BY RT PCR    EKG   Radiology No results found.  Procedures Procedures (including critical care time)  Medications Ordered in UC Medications - No data to display  Initial Impression / Assessment and Plan / UC Course  I have reviewed the triage vital signs and the nursing notes.  Pertinent labs & imaging results that were available during my care of the patient were reviewed by me and  considered in my  medical decision making (see chart for details).    Discussed exam findings and plan of care with patient, due to smoking history and symptoms we will treat with prednisone  and doxycycline, strict go to ER precautions given, patient verbalized understanding to this provider  Ddx: Copd exacerbation, Acute respiratory iinfection, ,Acute cough,Sinus pressure,allergies Final Clinical Impressions(s) / UC Diagnoses   Final diagnoses:  COPD exacerbation (HCC)  Acute cough  Acute respiratory infection  Sinus pressure  Smoker     Discharge Instructions      Check my chart for Covid results. Take your home meds as prescribed and allergy med(zyrtec) Adding prednisone  ans doxycycline(avoid sun) Please take OTC flonase nasal spray as label directed to help with symptoms Drink plenty of water Follow up with your PCP at [facility] next week if symptoms persist Stop smoking    ED Prescriptions     Medication Sig Dispense Auth. Provider   doxycycline (VIBRAMYCIN) 100 MG capsule Take 1 capsule (100 mg total) by mouth 2 (two) times daily for 7 days. 14 capsule Kalle Bernath, NP   predniSONE  (DELTASONE ) 50 MG tablet Take 1 tablet (50 mg total) by mouth daily with breakfast for 4 days. 4 tablet Kiran Carline, Eveleen Hinds, NP      PDMP not reviewed this encounter.   Peter Brands, NP 01/09/24 2114

## 2024-03-09 ENCOUNTER — Ambulatory Visit
Admission: EM | Admit: 2024-03-09 | Discharge: 2024-03-09 | Disposition: A | Discharge status: Home / Self Care | Attending: Emergency Medicine | Admitting: Emergency Medicine

## 2024-03-09 DIAGNOSIS — L03114 Cellulitis of left upper limb: Secondary | ICD-10-CM

## 2024-03-09 MED ORDER — DOXYCYCLINE HYCLATE 100 MG PO CAPS: 100.0000 mg | ORAL_CAPSULE | Freq: Two times a day (BID) | ORAL | 0 refills | Status: AC

## 2024-03-09 MED ORDER — DOXYCYCLINE HYCLATE 100 MG PO TABS
100.0000 mg | ORAL_TABLET | Freq: Once | ORAL | Status: AC
  Administered 2024-03-09: 100 mg via ORAL

## 2024-03-09 MED ORDER — DOXYCYCLINE HYCLATE 100 MG PO TABS: 100.0000 mg | ORAL_TABLET | Freq: Two times a day (BID) | ORAL | Status: DC

## 2024-03-09 NOTE — ED Triage Notes (Signed)
 Patient states that he woke up this morning with a quarter size mark on his left arm. Swelling and redness have spread and hurts to the touch.

## 2024-03-09 NOTE — ED Provider Notes (Signed)
 [facility]    CSN: [id number] Arrival date & time: 03/09/24  1836      History   Chief Complaint Chief Complaint  Patient presents with   Arm Swelling    HPI Brent Raymond is a 48 y.o. male.   HPI  48 year old male with past medical history significant for COPD presents for evaluation of painful redness to his left upper arm.  He reports that he woke up this morning and there was a quarter sized area of redness in the middle of his tiger tattoo.  As the days progressed the redness has continued to expand red and wraparound his arm.  He describes the redness as being both painful and itchy.  He denies any fever.  He has had some nausea.  Past Medical History:  Diagnosis Date   COPD (chronic obstructive pulmonary disease) (HCC)     Patient Active Problem List   Diagnosis Date Noted   COPD exacerbation (HCC) 01/09/2024   Acute cough 01/09/2024   Acute respiratory infection 01/09/2024   Sinus pressure 01/09/2024   Smoker 01/09/2024    Past Surgical History:  Procedure Laterality Date   APPENDECTOMY     CHOLECYSTECTOMY     tubes in ears as child         Home Medications    Prior to Admission medications   Medication Sig Start Date End Date Taking? Authorizing Provider  albuterol  (VENTOLIN  HFA) 108 (90 Base) MCG/ACT inhaler Inhale 2 puffs into the lungs every 6 (six) hours as needed for wheezing or shortness of breath. 09/01/22  Yes Arlander Charleston, MD  cetirizine (ZYRTEC) 10 MG tablet Take 10 mg by mouth daily.   Yes [provider]  doxycycline  (VIBRAMYCIN ) 100 MG capsule Take 1 capsule (100 mg total) by mouth 2 (two) times daily for 7 days. 03/09/24 03/16/24 Yes Bernardino Ditch, NP  fluticasone-salmeterol (ADVAIR) 250-50 MCG/ACT AEPB Inhale 1 puff into the lungs in the morning and at bedtime. 07/30/23  Yes [provider]  INCRUSE ELLIPTA 62.5 MCG/ACT AEPB Inhale 1 puff into the lungs daily.   Yes [provider]  omeprazole  (PRILOSEC) 20 MG capsule Take 20 mg by mouth daily.   Yes [provider]    Family History History reviewed. No pertinent family history.  Social History Social History   Tobacco Use   Smoking status: Every Day    Current packs/day: 1.00    Types: Cigarettes   Smokeless tobacco: Never  Vaping Use   Vaping status: Never Used  Substance Use Topics   Alcohol use: No   Drug use: Yes    Types: Cocaine     Allergies   Penicillins   Review of Systems Review of Systems  Constitutional:  Negative for fever.  Gastrointestinal:  Positive for nausea.  Skin:  Positive for color change.     Physical Exam Triage Vital Signs ED Triage Vitals  Encounter Vitals Group     BP 03/09/24 1846 125/84     Girls Systolic BP Percentile --      Girls Diastolic BP Percentile --      Boys Systolic BP Percentile --      Boys Diastolic BP Percentile --      Pulse Rate 03/09/24 1846 78     Resp 03/09/24 1846 19     Temp 03/09/24 1846 98.7 F (37.1 C)     Temp Source 03/09/24 1846 Oral     SpO2 03/09/24 1846 94 %  Weight --      Height --      Head Circumference --      Peak Flow --      Pain Score 03/09/24 1845 7     Pain Loc --      Pain Education --      Exclude from Growth Chart --    No data found.  Updated Vital Signs BP 125/84 (BP Location: Right Arm)   Pulse 78   Temp 98.7 F (37.1 C) (Oral)   Resp 19   SpO2 94%   Visual Acuity Right Eye Distance:   Left Eye Distance:   Bilateral Distance:    Right Eye Near:   Left Eye Near:    Bilateral Near:     Physical Exam Vitals and nursing note reviewed.  Constitutional:      Appearance: Normal appearance. He is not ill-appearing.  HENT:     Head: Normocephalic and atraumatic.  Skin:    General: Skin is warm and dry.     Capillary Refill: Capillary refill takes less than 2 seconds.     Findings: Erythema present.  Neurological:     General: No focal deficit present.     Mental Status: He is alert and  oriented to person, place, and time.      UC Treatments / Results  Labs (all labs ordered are listed, but only abnormal results are displayed) Labs Reviewed - No data to display  EKG   Radiology No results found.  Procedures Procedures (including critical care time)  Medications Ordered in UC Medications - No data to display  Initial Impression / Assessment and Plan / UC Course  I have reviewed the triage vital signs and the nursing notes.  Pertinent labs & imaging results that were available during my care of the patient were reviewed by me and considered in my medical decision making (see chart for details).   Patient is a pleasant, nontoxic-appearing 48 year old male presenting for evaluation of cellulitis to the left upper arm.    As you can see in image above, there is significant erythema from the elbow up to just shy of the deltoid.  The erythema began in the middle of the mouth at the Tattoo and has since spread.  The tattoo is old and he has not had any recent work done to it.  I cannot find any evidence of injury on the skin.  The area is tender to touch and hot to touch as well.  No induration or fluctuance.  I will treat the patient for cellulitis with doxycycline  twice daily for 7 days.  Have advised him to continue to monitor the area of redness.  If he continues to spread rapidly, he develops increased swelling, or he starts running fevers he needs to go to the ER for evaluation.   Final Clinical Impressions(s) / UC Diagnoses   Final diagnoses:  Cellulitis of left upper extremity     Discharge Instructions      Take the doxycycline  twice daily with food for 7 days for treatment of your cellulitis.  You may use over-the-counter Tylenol  and/or ibuprofen  according to the package instructions as needed for any fever or pain.  You may take over-the-counter Claritin, Zyrtec, or Allegra during the day to help with itching and use Benadryl at  nighttime.  Monitor for any increase in redness, swelling to your arm, red streaks going up your arm, or if you start running fevers.  If those conditions arise  you need to go to the ER for evaluation.     ED Prescriptions     Medication Sig Dispense Auth. Provider   doxycycline  (VIBRAMYCIN ) 100 MG capsule Take 1 capsule (100 mg total) by mouth 2 (two) times daily for 7 days. 14 capsule Bernardino Ditch, NP      PDMP not reviewed this encounter.   Bernardino Ditch, NP 03/09/24 [phone]

## 2024-03-09 NOTE — Discharge Instructions (Signed)
 Take the doxycycline  twice daily with food for 7 days for treatment of your cellulitis.  You may use over-the-counter Tylenol  and/or ibuprofen  according to the package instructions as needed for any fever or pain.  You may take over-the-counter Claritin, Zyrtec, or Allegra during the day to help with itching and use Benadryl at nighttime.  Monitor for any increase in redness, swelling to your arm, red streaks going up your arm, or if you start running fevers.  If those conditions arise you need to go to the ER for evaluation.

## 2024-05-26 ENCOUNTER — Ambulatory Visit: Admission: EM | Admit: 2024-05-26 | Discharge: 2024-05-26 | Attending: Family Medicine | Admitting: Family Medicine

## 2024-05-26 ENCOUNTER — Encounter: Payer: Self-pay | Admitting: Emergency Medicine

## 2024-05-26 DIAGNOSIS — R10813 Right lower quadrant abdominal tenderness: Secondary | ICD-10-CM | POA: Diagnosis not present

## 2024-05-26 DIAGNOSIS — R110 Nausea: Secondary | ICD-10-CM | POA: Diagnosis present

## 2024-05-26 LAB — URINALYSIS, W/ REFLEX TO CULTURE (INFECTION SUSPECTED)
Bilirubin Urine: NEGATIVE
Glucose, UA: NEGATIVE mg/dL
Hgb urine dipstick: NEGATIVE
Ketones, ur: NEGATIVE mg/dL
Leukocytes,Ua: NEGATIVE
Nitrite: NEGATIVE
Protein, ur: NEGATIVE mg/dL
Specific Gravity, Urine: <1.005 — ABNORMAL LOW (ref 1.005–1.030)
pH: 6 (ref 5.0–8.0)

## 2024-05-26 NOTE — ED Triage Notes (Signed)
 Pt presents with RLQ pain that started today. The pain is worse when he is standing upright. He has nausea only when the pain hits. Pt took Aleve this morning for the pain with no relief. Pt denies any urinary symptoms.

## 2024-05-26 NOTE — ED Provider Notes (Signed)
 [facility]    CSN: [id number] Arrival date & time: 05/26/24  1605      History   Chief Complaint Chief Complaint  Patient presents with   Abdominal Pain    HPI Brent Raymond is a 48 y.o. male.   HPI  Brent Raymond presents for constant non-radiating RLQ  abdominal pain that started 530 AM.  Has intermittent sharp stabby pains too.  Pain better somewhat while bending over.  Worse with cough.  No history of kidney stones.  The pain hits and he feels nauseous, clammy and like he is going to pass out.  Had 2 episodes on non-bloody diarrhea this morning. Took Aleve around 11 AM without relief.     Past Surgeries: had gallbladder and appendix removed   Symptoms Nausea/Vomiting: yes, nausea   Diarrhea:  yes  Constipation: no  Melena/BRBPR: no  Hematemesis: no  Anorexia: yes  Fever/Chills: yes, chills   Dysuria: no  Urinary frequency: not increased  Urinary urgency: no  Hematuria: no Rash: no  Wt loss: no  EtOH use: no  NSAIDs/ASA: yes  Sore throat: no   Cough: not new due to his COPD  Sleep disturbance: yes  Back Pain:  not more than normal  Headache: intermittent today   Past Medical History:  Diagnosis Date   COPD (chronic obstructive pulmonary disease) (HCC)     Patient Active Problem List   Diagnosis Date Noted   COPD exacerbation (HCC) 01/09/2024   Acute cough 01/09/2024   Acute respiratory infection 01/09/2024   Sinus pressure 01/09/2024   Smoker 01/09/2024    Past Surgical History:  Procedure Laterality Date   APPENDECTOMY     CHOLECYSTECTOMY     tubes in ears as child         Home Medications    Prior to Admission medications   Medication Sig Start Date End Date Taking? Authorizing Provider  albuterol  (VENTOLIN  HFA) 108 (90 Base) MCG/ACT inhaler Inhale 2 puffs into the lungs every 6 (six) hours as needed for wheezing or shortness of breath. 09/01/22   Brent Charleston, MD  cetirizine (ZYRTEC) 10 MG tablet Take 10 mg by mouth daily.     [provider]  fluticasone-salmeterol (ADVAIR) 250-50 MCG/ACT AEPB Inhale 1 puff into the lungs in the morning and at bedtime. 07/30/23   [provider]  INCRUSE ELLIPTA 62.5 MCG/ACT AEPB Inhale 1 puff into the lungs daily.    [provider]  omeprazole (PRILOSEC) 20 MG capsule Take 20 mg by mouth daily.    [provider]    Family History History reviewed. No pertinent family history.  Social History Social History   Tobacco Use   Smoking status: Every Day    Current packs/day: 1.00    Types: Cigarettes   Smokeless tobacco: Never  Vaping Use   Vaping status: Never Used  Substance Use Topics   Alcohol use: No   Drug use: Yes    Types: Cocaine, Marijuana     Allergies   Penicillins   Review of Systems Review of Systems :negative unless otherwise stated in HPI.      Physical Exam Triage Vital Signs ED Triage Vitals  Encounter Vitals Group     BP 05/26/24 1619 125/83     Girls Systolic BP Percentile --      Girls Diastolic BP Percentile --      Boys Systolic BP Percentile --      Boys Diastolic BP Percentile --  Pulse Rate 05/26/24 1619 (!) 107     Resp 05/26/24 1619 18     Temp 05/26/24 1619 98.2 F (36.8 C)     Temp Source 05/26/24 1619 Oral     SpO2 05/26/24 1619 94 %     Weight --      Height --      Head Circumference --      Peak Flow --      Pain Score 05/26/24 1617 7     Pain Loc --      Pain Education --      Exclude from Growth Chart --    No data found.  Updated Vital Signs BP 125/83 (BP Location: Right Arm)   Pulse (!) 107   Temp 98.2 F (36.8 C) (Oral)   Resp 18   SpO2 94%   Visual Acuity Right Eye Distance:   Left Eye Distance:   Bilateral Distance:    Right Eye Near:   Left Eye Near:    Bilateral Near:     Physical Exam  GEN: pleasant well appearing male, in no acute distress *** CV: regular rate and rhythm, no murmurs appreciated *** RESP: no increased work of breathing, clear  to ascultation bilaterally ABD: Bowel sounds present. Soft,***non-tender,***non-distended. ***No guarding,***no rebound,***no appreciable hepatosplenomegaly,***no CVA tenderness,***negative McBurney's,***negative Murphy MSK: no extremity edema SKIN: warm, dry, no rash on visible skin NEURO: alert, moves all extremities appropriately PSYCH: Normal affect, appropriate speech and behavior   UC Treatments / Results  Labs (all labs ordered are listed, but only abnormal results are displayed) Labs Reviewed  URINALYSIS, W/ REFLEX TO CULTURE (INFECTION SUSPECTED)    EKG  If EKG performed, see my interpretation and MDM section  Radiology No results found.   Procedures Procedures (including critical care time)  Medications Ordered in UC Medications - No data to display  Initial Impression / Assessment and Plan / UC Course  I have reviewed the triage vital signs and the nursing notes.  Pertinent labs & imaging results that were available during my care of the patient were reviewed by me and considered in my medical decision making (see chart for details).     ***  Patient is a  48 y.o. malewith history *** who presents after having insidious severe abdominal pain about *** ago.  Overall, patient is well-appearing, well-hydrated, and in no acute distress.  Vital signs stable.  [place] afebrile.  Exam is ***not concerning for an acute abdomen.  Obtained UA, urine pregnancy***, CBC, CMP, and lipase.  No personal history of kidney stones.    DDX includes but not limited to: UTI, STI, cholecystitis, pancreatitis, gastroenteritis, nephrolithiasis, constipation, appendicitis,***ovarian torsion/cyst    DDX:  -UTI: Urine unremarkable -STI: Not sexually active -Ovarian torsion versus cyst: Pain is epigastric less likely -Biliary colic/gallstone: Labs today, consider right upper quadrant ultrasound -Pancreatitis: Lipase today -Gastroenteritis: Not likely given no vomiting or  diarrhea -Kidney stone: No hematuria on UA, no CVA tenderness -Constipation: None reported -Appendicitis: status post recent appendectomy   Constipation: ***Provided and reviewed constipation clean out and maintenance plan with patient and ***. Discussed eating small meals frequently and increased fluid intake, as well as gradual increase in fiber intake with dried fruits, vegetables with skins, beans, whole grains and cereal. Goal ***25-35g of fiber per day. Encouraged drinking hot beverages and prune juice; add probiotic-containing foods like pasteurized yogurt and kefir; encourage increased physical activity.     Follow-up, return and ED precautions given.  Discussed MDM, treatment  plan and plan for follow-up with patient/parent who agrees with plan.    Final Clinical Impressions(s) / UC Diagnoses   Final diagnoses:  None   Discharge Instructions   None    ED Prescriptions   None    PDMP not reviewed this encounter.

## 2024-05-26 NOTE — Discharge Instructions (Signed)

## 2024-05-26 NOTE — ED Notes (Addendum)
 Patient is being discharged from the Urgent Care and sent to the Emergency Department via POV . Per Caprice Porteous DO, patient is in need of higher level of care due to RLQ pain . Patient is aware and verbalizes understanding of plan of care.  Vitals:   05/26/24 1619  BP: 125/83  Pulse: (!) 107  Resp: 18  Temp: 98.2 F (36.8 C)  SpO2: 94%

## 2024-05-26 NOTE — ED Notes (Signed)
 Patient is being discharged from the Urgent Care and sent to the Emergency Department via POV . Per Dr.Brimage, patient is in need of higher level of care due to abdominal pain. Patient is aware and verbalizes understanding of plan of care.  Vitals:   05/26/24 1619  BP: 125/83  Pulse: (!) 107  Resp: 18  Temp: 98.2 F (36.8 C)  SpO2: 94%
